# Patient Record
Sex: Male | Born: 1990 | Race: White | Hispanic: No | Marital: Married | Smoking: Former smoker
Health system: Southern US, Community
[De-identification: ages and names within clinical notes are randomized; demographics above are authoritative.]

## PROBLEM LIST (undated history)

## (undated) HISTORY — PX: TONSILLECTOMY: SUR1361

---

## 2015-09-08 ENCOUNTER — Encounter (HOSPITAL_BASED_OUTPATIENT_CLINIC_OR_DEPARTMENT_OTHER): Payer: Self-pay | Admitting: *Deleted

## 2015-09-08 ENCOUNTER — Emergency Department (HOSPITAL_BASED_OUTPATIENT_CLINIC_OR_DEPARTMENT_OTHER)
Admission: EM | Admit: 2015-09-08 | Discharge: 2015-09-08 | Disposition: A | Discharge status: Home / Self Care | Payer: Self-pay | Attending: Emergency Medicine | Admitting: Emergency Medicine

## 2015-09-08 ENCOUNTER — Emergency Department (HOSPITAL_BASED_OUTPATIENT_CLINIC_OR_DEPARTMENT_OTHER): Payer: Self-pay

## 2015-09-08 DIAGNOSIS — R229 Localized swelling, mass and lump, unspecified: Secondary | ICD-10-CM

## 2015-09-08 DIAGNOSIS — Z791 Long term (current) use of non-steroidal anti-inflammatories (NSAID): Secondary | ICD-10-CM | POA: Insufficient documentation

## 2015-09-08 DIAGNOSIS — Y998 Other external cause status: Secondary | ICD-10-CM | POA: Insufficient documentation

## 2015-09-08 DIAGNOSIS — W010XXA Fall on same level from slipping, tripping and stumbling without subsequent striking against object, initial encounter: Secondary | ICD-10-CM | POA: Insufficient documentation

## 2015-09-08 DIAGNOSIS — S6991XA Unspecified injury of right wrist, hand and finger(s), initial encounter: Secondary | ICD-10-CM | POA: Insufficient documentation

## 2015-09-08 DIAGNOSIS — Y9289 Other specified places as the place of occurrence of the external cause: Secondary | ICD-10-CM | POA: Insufficient documentation

## 2015-09-08 DIAGNOSIS — Y93K1 Activity, walking an animal: Secondary | ICD-10-CM | POA: Insufficient documentation

## 2015-09-08 NOTE — Discharge Instructions (Signed)
1. May take your Meloxicam for pain relief, or Ibuprofen 400-600 mg every 8 hours.  Do NOT combine Meloxicam and Ibuprofen.

## 2015-09-08 NOTE — ED Notes (Signed)
Ice pack provided for pt comfort.

## 2015-09-08 NOTE — ED Notes (Signed)
Pt slipped on a  wet porch and injured his right hand.

## 2015-09-08 NOTE — ED Notes (Signed)
MD at bedside. 

## 2015-09-08 NOTE — ED Notes (Signed)
Presents with rt hand injury, walking dog last PM, slipped on wet concrete floor,  Landed on rt hand, obvious swelling. Used ice for swelling.

## 2015-09-08 NOTE — ED Provider Notes (Signed)
CSN: [id number]     Arrival date & time 09/08/15  0846 History   First MD Initiated Contact with Patient 09/08/15 [phone]     Chief Complaint  Patient presents with  . Hand Pain     (Consider location/radiation/quality/duration/timing/severity/associated sxs/prior Treatment) HPI Matthew Woods is a 24 yo male with no significant PMH presenting with one day history of swollen right hand after sustaining mechanical fall.  Patient slipped while walking his dog, landing on the knuckles of a closed right fist.  Patient reports immediate swelling of the hand, worse last night, though improved this morning.  The pain is 0-1 at rest, but increases to 6/10 with movement.  He endorses pain over right 3, 4, and 5 MCPs, with radiation to the hypothenar eminence.  He reports full ROM, except that he cannot make a complete fist.  He denies pain to the wrist, elbow, shoulder, PIP, or DIP joints.   History reviewed. No pertinent past medical history. Past Surgical History  Procedure Laterality Date  . Tonsillectomy     No family history on file. Social History  Substance Use Topics  . Smoking status: Never Smoker   . Smokeless tobacco: None  . Alcohol Use: No    Review of Systems  Constitutional: Negative for fever and chills.  HENT: Negative for congestion.   Respiratory: Negative for cough and shortness of breath.   Cardiovascular: Negative for chest pain.  Gastrointestinal: Negative for abdominal pain.  Genitourinary: Negative for dysuria.  Musculoskeletal: Positive for joint swelling.  Skin: Negative for rash.      Allergies  Review of patient's allergies indicates no known allergies.  Home Medications   Prior to Admission medications   Medication Sig Start Date End Date Taking? Authorizing Provider  meloxicam (MOBIC) 15 MG tablet Take 15 mg by mouth daily.   Yes Historical Provider, MD   BP 138/88 mmHg  Pulse 66  Temp(Src) 98.6 F (37 C) (Oral)  Resp 16  Ht  (1.753 m)  Wt  257 lb (116.574 kg)  BMI 37.93 kg/m2  SpO2 97% Physical Exam  Constitutional: He appears well-developed and well-nourished. No distress.  HENT:  Head: Normocephalic and atraumatic.  Eyes: EOM are normal. No scleral icterus.  Cardiovascular: Normal rate, regular rhythm and normal heart sounds.   Pulmonary/Chest: Effort normal and breath sounds normal.  Musculoskeletal:  Swelling over right 3rd, 4th, and 5th MCPs.  TTP over dorsal right hand and hypothenar eminence.  ROM limited to 80% of full fist.  No pain or decreased ROM at wrist, elbow, or shoulder.  Radial pulse 2+ with collateral ulnar flow.   Skin: Skin is warm and dry. He is not diaphoretic.    ED Course  Procedures (including critical care time) Labs Review Labs Reviewed - No data to display  Imaging Review No results found. I have personally reviewed and evaluated these images and lab results as part of my medical decision-making.   EKG Interpretation None      MDM   Final diagnoses:  None   Hand contusion/soft tissue swelling: No fractures of hand or wrist.  Patient reports pain well controlled. He was advised to rest, ice, and elevate the limb. Also advised patient he may take his home Meloxicam or Ibuprofen 400-600 mg q8hours, but not to combine the two medications.  He expressed the desire to return to work tomorrow.   Jana Half, MD 09/08/15 3086  Nelva Nay, MD 09/08/15 1323

## 2016-06-01 ENCOUNTER — Encounter (HOSPITAL_BASED_OUTPATIENT_CLINIC_OR_DEPARTMENT_OTHER): Payer: Self-pay | Admitting: *Deleted

## 2016-06-01 ENCOUNTER — Emergency Department (HOSPITAL_BASED_OUTPATIENT_CLINIC_OR_DEPARTMENT_OTHER)
Admission: EM | Admit: 2016-06-01 | Discharge: 2016-06-01 | Disposition: A | Discharge status: Home / Self Care | Payer: No Typology Code available for payment source | Attending: Emergency Medicine | Admitting: Emergency Medicine

## 2016-06-01 DIAGNOSIS — K920 Hematemesis: Secondary | ICD-10-CM | POA: Diagnosis not present

## 2016-06-01 DIAGNOSIS — R7401 Elevation of levels of liver transaminase levels: Secondary | ICD-10-CM

## 2016-06-01 DIAGNOSIS — R740 Nonspecific elevation of levels of transaminase and lactic acid dehydrogenase [LDH]: Secondary | ICD-10-CM | POA: Diagnosis not present

## 2016-06-01 DIAGNOSIS — R112 Nausea with vomiting, unspecified: Secondary | ICD-10-CM | POA: Diagnosis present

## 2016-06-01 LAB — CBC WITH DIFFERENTIAL/PLATELET
BASOS PCT: 0 %
Basophils Absolute: 0 10*3/uL (ref 0.0–0.1)
EOS ABS: 0.1 10*3/uL (ref 0.0–0.7)
Eosinophils Relative: 1 %
HEMATOCRIT: 44.7 % (ref 39.0–52.0)
HEMOGLOBIN: 15.9 g/dL (ref 13.0–17.0)
LYMPHS ABS: 1.7 10*3/uL (ref 0.7–4.0)
Lymphocytes Relative: 18 %
MCH: 30.1 pg (ref 26.0–34.0)
MCHC: 35.6 g/dL (ref 30.0–36.0)
MCV: 84.7 fL (ref 78.0–100.0)
MONO ABS: 0.8 10*3/uL (ref 0.1–1.0)
MONOS PCT: 8 %
NEUTROS ABS: 7.4 10*3/uL (ref 1.7–7.7)
NEUTROS PCT: 73 %
PLATELETS: 224 10*3/uL (ref 150–400)
RBC: 5.28 MIL/uL (ref 4.22–5.81)
RDW: 12.1 % (ref 11.5–15.5)
WBC: 9.9 10*3/uL (ref 4.0–10.5)

## 2016-06-01 LAB — COMPREHENSIVE METABOLIC PANEL
ALBUMIN: 4.8 g/dL (ref 3.5–5.0)
ALK PHOS: 95 U/L (ref 38–126)
ALT: 114 U/L — ABNORMAL HIGH (ref 17–63)
ANION GAP: 7 (ref 5–15)
AST: 66 U/L — AB (ref 15–41)
BILIRUBIN TOTAL: 1.1 mg/dL (ref 0.3–1.2)
BUN: 15 mg/dL (ref 6–20)
CALCIUM: 9.6 mg/dL (ref 8.9–10.3)
CO2: 26 mmol/L (ref 22–32)
Chloride: 103 mmol/L (ref 101–111)
Creatinine, Ser: 1.19 mg/dL (ref 0.61–1.24)
GFR calc Af Amer: >60 mL/min (ref >60)
GFR calc non Af Amer: >60 mL/min (ref >60)
GLUCOSE: 93 mg/dL (ref 65–99)
Potassium: 3.9 mmol/L (ref 3.5–5.1)
SODIUM: 136 mmol/L (ref 135–145)
TOTAL PROTEIN: 8.2 g/dL — AB (ref 6.5–8.1)

## 2016-06-01 LAB — URINALYSIS, ROUTINE W REFLEX MICROSCOPIC
Bilirubin Urine: NEGATIVE
GLUCOSE, UA: NEGATIVE mg/dL
HGB URINE DIPSTICK: NEGATIVE
Ketones, ur: NEGATIVE mg/dL
Leukocytes, UA: NEGATIVE
Nitrite: NEGATIVE
PH: 5.5 (ref 5.0–8.0)
Protein, ur: 30 mg/dL — AB
SPECIFIC GRAVITY, URINE: 1.022 (ref 1.005–1.030)

## 2016-06-01 LAB — URINE MICROSCOPIC-ADD ON
RBC / HPF: NONE SEEN RBC/hpf (ref 0–5)
Squamous Epithelial / LPF: NONE SEEN
WBC, UA: NONE SEEN WBC/hpf (ref 0–5)

## 2016-06-01 LAB — LIPASE, BLOOD: Lipase: 23 U/L (ref 11–51)

## 2016-06-01 MED ORDER — OMEPRAZOLE 20 MG PO CPDR: 20.0000 mg | DELAYED_RELEASE_CAPSULE | Freq: Every day | ORAL | Status: DC

## 2016-06-01 MED ORDER — SODIUM CHLORIDE 0.9 % IV BOLUS (SEPSIS)
1000.0000 mL | Freq: Once | INTRAVENOUS | Status: AC
  Administered 2016-06-01: 1000 mL via INTRAVENOUS

## 2016-06-01 MED ORDER — ONDANSETRON HCL 4 MG PO TABS: 4.0000 mg | ORAL_TABLET | Freq: Four times a day (QID) | ORAL | Status: DC

## 2016-06-01 MED ORDER — FAMOTIDINE IN NACL 20-0.9 MG/50ML-% IV SOLN
20.0000 mg | Freq: Once | INTRAVENOUS | Status: AC
  Administered 2016-06-01: 20 mg via INTRAVENOUS
  Filled 2016-06-01: qty 50

## 2016-06-01 NOTE — Discharge Instructions (Signed)
Hematemesis Hematemesis is when you vomit blood. It is a sign of bleeding in the upper part of your digestive tract. This is also called your gastrointestinal (GI) tract. Your upper GI tract includes your mouth, throat, esophagus, stomach, and the first part of your small intestine (duodenum).  Hematemesis is usually caused by bleeding from your esophagus or stomach. You may suddenly vomit bright red blood. You might also vomit old blood. It may look like coffee grounds. You may also have other symptoms, such as:  Stomach pain.  Heartburn.  Black and tarry stool.  HOME CARE INSTRUCTIONS  Watch your hematemesis for any changes. The following actions may help to lessen any discomfort you are feeling:  Take medicines only as directed by your health care provider. Do not take aspirin, ibuprofen, or any other anti-inflammatory medicine without approval from your health care provider.  Rest as needed.  Drink small sips of clear liquids often, as long as you can keep them down. Try to drink enough fluids to keep your urine clear or pale yellow.  Do not drink alcohol.  Do not use any tobacco products, including cigarettes, chewing tobacco, or electronic cigarettes. If you need help quitting, ask your health care provider.  Keep all follow-up visits as directed by your health care provider. This is important. SEEK MEDICAL CARE IF:   The vomiting of blood worsens, or begins again after it has stopped.  You have persistent stomach pain.  You have nausea, indigestion, or heartburn.  You feel weak or dizzy. SEEK IMMEDIATE MEDICAL CARE IF:   You faint or feel extremely weak.  You have a rapid heartbeat.  You are urinating less than normal or not at all.  You have persistent vomiting.  You vomit large amounts of bloody or dark material.  You vomit bright red blood.  You pass large, dark, or bloody stools.  You have chest pain or trouble breathing.   This information is not  intended to replace advice given to you by your health care provider. Make sure you discuss any questions you have with your health care provider.   Document Released: 01/09/2005 Document Revised: 12/23/2014 Document Reviewed: 07/27/2014 Elsevier Interactive Patient Education 2016 [employer]  

## 2016-06-01 NOTE — ED Provider Notes (Signed)
CSN: [id number]     Arrival date & time 06/01/16  [id number] History   First MD Initiated Contact with Patient 06/01/16 [phone]     Chief Complaint  Patient presents with  . Emesis     (Consider location/radiation/quality/duration/timing/severity/associated sxs/prior Treatment) HPI Matthew Woods is a 25 y.o. male with PMH significant for HTN who presents with intermittent, unprovoked, episodes of N/V that have occurred approximately 4 times since Wednesday.  He reports he noticed "streaks of blood" he describes as dark-medium red yesterday and today.  These episodes of N/V last about 30 minutes and come on unprovoked.  He was having conversation this morning when it occurred.  Denies fever, chills, CP, SOB, dizziness, lightheadness, syncope, abdominal pain, current N/V, diarrhea, melena, hematochezia.  He does take occasional ibuprofen for knee pain, but denies daily use.  Denies EtOH use.  He does not smoke.  No modifying or aggravating factors.  History reviewed. No pertinent past medical history. Past Surgical History  Procedure Laterality Date  . Tonsillectomy     History reviewed. No pertinent family history. Social History  Substance Use Topics  . Smoking status: Never Smoker   . Smokeless tobacco: None  . Alcohol Use: No    Review of Systems All other systems negative unless otherwise stated in HPI    Allergies  Review of patient's allergies indicates no known allergies.  Home Medications   Prior to Admission medications   Not on File   BP 134/87 mmHg  Pulse 78  Temp(Src) 98.8 F (37.1 C) (Oral)  Resp 20  Ht 5\' 9"  (1.753 m)  Wt 112.038 kg  BMI 36.46 kg/m2  SpO2 98% Physical Exam  Constitutional: He is oriented to person, place, and time. He appears well-developed and well-nourished.  Non-toxic appearance. He does not have a sickly appearance. He does not appear ill.  HENT:  Head: Normocephalic and atraumatic.  Mouth/Throat: Oropharynx is clear and moist.  Eyes:  Conjunctivae are normal.  Neck: Normal range of motion. Neck supple.  Cardiovascular: Normal rate and regular rhythm.   Pulmonary/Chest: Effort normal and breath sounds normal. No accessory muscle usage or stridor. No respiratory distress. He has no wheezes. He has no rhonchi. He has no rales.  Abdominal: Soft. Bowel sounds are normal. He exhibits no distension. There is no tenderness. There is no rebound and no guarding.  Musculoskeletal: Normal range of motion.  Lymphadenopathy:    He has no cervical adenopathy.  Neurological: He is alert and oriented to person, place, and time.  Skin: Skin is warm and dry.  Psychiatric: He has a normal mood and affect. His behavior is normal.    ED Course  Procedures (including critical care time) Labs Review Labs Reviewed  COMPREHENSIVE METABOLIC PANEL - Abnormal; Notable for the following:    Total Protein 8.2 (*)    AST 66 (*)    ALT 114 (*)    All other components within normal limits  URINALYSIS, ROUTINE W REFLEX MICROSCOPIC (NOT AT [facility]) - Abnormal; Notable for the following:    APPearance CLOUDY (*)    Protein, ur 30 (*)    All other components within normal limits  URINE MICROSCOPIC-ADD ON - Abnormal; Notable for the following:    Bacteria, UA MANY (*)    All other components within normal limits  LIPASE, BLOOD  CBC WITH DIFFERENTIAL/PLATELET    Imaging Review No results found. I have personally reviewed and evaluated these images and lab results as part of my medical decision-making.  EKG Interpretation None      MDM   Final diagnoses:  Elevated transaminase level  Hematemesis with nausea   Patient presents with intermittent episodes of emesis with streaks of blood.  VSS, NAD.  On exam, patient appears well, non-toxic, or septic appearing.  Heart RRR, lungs CTAB, abdomen soft and benign.  DDx includes gastritis, mallory-weiss tear, PUD.  Patient is hemodynamically stable at this time.  Doubt esophageal rupture.  Will  obtain labs, give IVF, pepcid and reassess.    Patient has not had any vomiting here.  Labs remarkable for elevated transaminases, AST 66 ALT 114.  This appears to be consistent for his baseline, Care Everywhere shows last values 05/23/16 show AST 44 ALT 91.  Remaining labs unremarkable.  Hgb stable.    Discussed these findings with the patient and recommend GI follow up for possible further evaluation with EGD.  He had negative hepatitis panel performed and RUQ ultrasound 02/2015 with normal findings. Discharge home with Zofran and omeprazole. He does not take Tylenol, recommend avoiding Tylenol due to increased LFTs. Discussed return precautions.  Patient agrees and acknowledges the above plan for discharge.   Case has been discussed with Dr. Fredderick Phenix who agrees with the above plan for discharge.      Cheri Fowler, PA-C 06/01/16 1149  Rolan Bucco, MD 06/01/16 [phone]

## 2016-06-01 NOTE — ED Notes (Signed)
Pt reports emesis once last night, and once today. Denies any abd pain, or nausea at this time. Pt states "there was streaks of blood in it". Last bm yesterday, normal.

## 2017-05-13 ENCOUNTER — Encounter (HOSPITAL_BASED_OUTPATIENT_CLINIC_OR_DEPARTMENT_OTHER): Payer: Self-pay | Admitting: Emergency Medicine

## 2017-05-13 ENCOUNTER — Emergency Department (HOSPITAL_BASED_OUTPATIENT_CLINIC_OR_DEPARTMENT_OTHER)
Admission: EM | Admit: 2017-05-13 | Discharge: 2017-05-13 | Disposition: A | Discharge status: Home / Self Care | Payer: PRIVATE HEALTH INSURANCE | Attending: Emergency Medicine | Admitting: Emergency Medicine

## 2017-05-13 DIAGNOSIS — L551 Sunburn of second degree: Secondary | ICD-10-CM | POA: Diagnosis present

## 2017-05-13 DIAGNOSIS — Z23 Encounter for immunization: Secondary | ICD-10-CM | POA: Insufficient documentation

## 2017-05-13 MED ORDER — NAPROXEN 250 MG PO TABS
500.0000 mg | ORAL_TABLET | Freq: Once | ORAL | Status: AC
Start: 2017-05-13 — End: 2017-05-13
  Administered 2017-05-13: 500 mg via ORAL
  Filled 2017-05-13: qty 2

## 2017-05-13 MED ORDER — HYDROCODONE-ACETAMINOPHEN 5-325 MG PO TABS: 1.0000 | ORAL_TABLET | Freq: Four times a day (QID) | ORAL | 0 refills | Status: DC | PRN

## 2017-05-13 MED ORDER — HYDROCODONE-ACETAMINOPHEN 5-325 MG PO TABS
2.0000 | ORAL_TABLET | Freq: Once | ORAL | Status: AC
  Administered 2017-05-13: 2 via ORAL
  Filled 2017-05-13: qty 2

## 2017-05-13 MED ORDER — SILVER SULFADIAZINE 1 % EX CREA: TOPICAL_CREAM | Freq: Two times a day (BID) | CUTANEOUS | Status: DC

## 2017-05-13 MED ORDER — SILVER SULFADIAZINE 1 % EX CREA: TOPICAL_CREAM | CUTANEOUS | 1 refills | Status: DC

## 2017-05-13 MED ORDER — TETANUS-DIPHTH-ACELL PERTUSSIS 5-2.5-18.5 LF-MCG/0.5 IM SUSP
0.5000 mL | Freq: Once | INTRAMUSCULAR | Status: AC
  Administered 2017-05-13: 0.5 mL via INTRAMUSCULAR
  Filled 2017-05-13: qty 0.5

## 2017-05-13 NOTE — ED Triage Notes (Signed)
Pt with sunburn to upper arms and shoulders bilaterally. Pt has fluid filled blisters.

## 2017-05-13 NOTE — ED Provider Notes (Signed)
   [facility] Provider Note: Lowella DellJ. Lane Rosalene Wardrop, MD, FACEP  CSN: [id number] MRN: [id number] ARRIVAL: 05/13/17 at 0224 ROOM: [facility]01/[facility]01   CHIEF COMPLAINT  Sunburn   HISTORY OF PRESENT ILLNESS  Alric RanDavid Swantek is a 26 y.o. male who was out in the sun 3 days ago with his shoulders, neck and upper arms exposed. He subsequently developed sunburn which acutely worsened over the past 24 hours. He now has multiple blisters with weeping. There is significant associated pain which she rates as a 9 out of 10 at its worst. Pain is worse with movement or palpation. He has only taken Tylenol for the pain without significant relief.  [place] reveals the patient has received no opioid prescriptions in the past year.   History reviewed. No pertinent past medical history.  Past Surgical History:  Procedure Laterality Date  . TONSILLECTOMY      No family history on file.  Social History  Substance Use Topics  . Smoking status: Never Smoker  . Smokeless tobacco: Never Used  . Alcohol use No    Prior to Admission medications   Not on File    Allergies Rocephin [ceftriaxone]   REVIEW OF SYSTEMS  Negative except as noted here or in the History of Present Illness.   PHYSICAL EXAMINATION  Initial Vital Signs Blood pressure (!) 193/135, pulse 86, temperature 98.3 F (36.8 C), temperature source Oral, resp. rate 18, height 5\' 10"  (1.778 m), weight 113.4 kg (250 lb), SpO2 100 %.  Examination General: Well-developed, well-nourished male in no acute distress; appearance consistent with age of record HENT: normocephalic; atraumatic Eyes: Normal appearance Neck: supple Heart: regular rate and rhythm Lungs: clear to auscultation bilaterally Abdomen: soft; nondistended; nontender; bowel sounds present Extremities: No deformity; full range of motion; pulses normal Neurologic: Awake, alert and oriented; motor function  intact in all extremities and symmetric; no facial droop Skin: Warm and dry; first to second-degree sunburn of back of neck, shoulders and upper arms:    Psychiatric: Normal mood and affect   RESULTS  Summary of this visit's results, reviewed by myself:   EKG Interpretation  Date/Time:    Ventricular Rate:    PR Interval:    QRS Duration:   QT Interval:    QTC Calculation:   R Axis:     Text Interpretation:        Laboratory Studies: No results found for this or any previous visit (from the past 24 hour(s)). Imaging Studies: No results found.  ED COURSE  Nursing notes and initial vitals signs, including pulse oximetry, reviewed.  Vitals:   05/13/17 0229  BP: (!) 193/135  Pulse: 86  Resp: 18  Temp: 98.3 F (36.8 C)  TempSrc: Oral  SpO2: 100%  Weight: 113.4 kg (250 lb)  Height: 5\' 10"  (1.778 m)    PROCEDURES    ED DIAGNOSES     ICD-9-CM ICD-10-CM   1. Sunburn of second degree 692.76 L55.1        Marlos Carmen, Jonny RuizJohn, MD 05/13/17 [phone]

## 2017-09-05 ENCOUNTER — Emergency Department (HOSPITAL_BASED_OUTPATIENT_CLINIC_OR_DEPARTMENT_OTHER)
Admission: EM | Admit: 2017-09-05 | Discharge: 2017-09-05 | Disposition: A | Discharge status: Home / Self Care | Payer: PRIVATE HEALTH INSURANCE | Attending: Emergency Medicine | Admitting: Emergency Medicine

## 2017-09-05 ENCOUNTER — Encounter (HOSPITAL_BASED_OUTPATIENT_CLINIC_OR_DEPARTMENT_OTHER): Payer: Self-pay | Admitting: Emergency Medicine

## 2017-09-05 DIAGNOSIS — R197 Diarrhea, unspecified: Secondary | ICD-10-CM | POA: Insufficient documentation

## 2017-09-05 DIAGNOSIS — R112 Nausea with vomiting, unspecified: Secondary | ICD-10-CM | POA: Diagnosis not present

## 2017-09-05 LAB — CBC WITH DIFFERENTIAL/PLATELET
Basophils Absolute: 0 10*3/uL (ref 0.0–0.1)
Basophils Relative: 1 %
EOS ABS: 0.1 10*3/uL (ref 0.0–0.7)
EOS PCT: 1 %
HCT: 41.1 % (ref 39.0–52.0)
Hemoglobin: 14.3 g/dL (ref 13.0–17.0)
LYMPHS PCT: 22 %
Lymphs Abs: 1.8 10*3/uL (ref 0.7–4.0)
MCH: 30.6 pg (ref 26.0–34.0)
MCHC: 34.8 g/dL (ref 30.0–36.0)
MCV: 87.8 fL (ref 78.0–100.0)
MONO ABS: 0.9 10*3/uL (ref 0.1–1.0)
Monocytes Relative: 11 %
Neutro Abs: 5.2 10*3/uL (ref 1.7–7.7)
Neutrophils Relative %: 65 %
PLATELETS: 222 10*3/uL (ref 150–400)
RBC: 4.68 MIL/uL (ref 4.22–5.81)
RDW: 12.9 % (ref 11.5–15.5)
WBC: 7.9 10*3/uL (ref 4.0–10.5)

## 2017-09-05 LAB — URINALYSIS, ROUTINE W REFLEX MICROSCOPIC
BILIRUBIN URINE: NEGATIVE
GLUCOSE, UA: NEGATIVE mg/dL
HGB URINE DIPSTICK: NEGATIVE
Ketones, ur: NEGATIVE mg/dL
Leukocytes, UA: NEGATIVE
Nitrite: NEGATIVE
PROTEIN: NEGATIVE mg/dL
SPECIFIC GRAVITY, URINE: 1.02 (ref 1.005–1.030)
pH: 6 (ref 5.0–8.0)

## 2017-09-05 LAB — COMPREHENSIVE METABOLIC PANEL
ALBUMIN: 4.4 g/dL (ref 3.5–5.0)
ALT: 84 U/L — AB (ref 17–63)
AST: 54 U/L — AB (ref 15–41)
Alkaline Phosphatase: 89 U/L (ref 38–126)
Anion gap: 6 (ref 5–15)
BILIRUBIN TOTAL: 1.4 mg/dL — AB (ref 0.3–1.2)
BUN: 21 mg/dL — AB (ref 6–20)
CO2: 25 mmol/L (ref 22–32)
CREATININE: 1.26 mg/dL — AB (ref 0.61–1.24)
Calcium: 9.2 mg/dL (ref 8.9–10.3)
Chloride: 104 mmol/L (ref 101–111)
GFR calc Af Amer: >60 mL/min (ref >60)
GLUCOSE: 112 mg/dL — AB (ref 65–99)
Potassium: 3.4 mmol/L — ABNORMAL LOW (ref 3.5–5.1)
Sodium: 135 mmol/L (ref 135–145)
TOTAL PROTEIN: 7.9 g/dL (ref 6.5–8.1)

## 2017-09-05 LAB — LIPASE, BLOOD: Lipase: 27 U/L (ref 11–51)

## 2017-09-05 MED ORDER — ONDANSETRON HCL 4 MG/2ML IJ SOLN
4.0000 mg | Freq: Once | INTRAMUSCULAR | Status: AC | PRN
  Administered 2017-09-05: 4 mg via INTRAVENOUS

## 2017-09-05 MED ORDER — SODIUM CHLORIDE 0.9 % IV BOLUS (SEPSIS)
1000.0000 mL | Freq: Once | INTRAVENOUS | Status: AC
  Administered 2017-09-05: 1000 mL via INTRAVENOUS

## 2017-09-05 MED ORDER — ONDANSETRON HCL 4 MG/2ML IJ SOLN
INTRAMUSCULAR | Status: AC
  Administered 2017-09-05: 4 mg via INTRAVENOUS
  Filled 2017-09-05: qty 2

## 2017-09-05 MED ORDER — ONDANSETRON 4 MG PO TBDP: 4.0000 mg | ORAL_TABLET | Freq: Three times a day (TID) | ORAL | 0 refills | Status: DC | PRN

## 2017-09-05 MED FILL — ONDANSETRON ODT 4 MG TABLET: 4 | 3 days supply | Qty: 8 | Fill #0

## 2017-09-05 NOTE — Discharge Instructions (Signed)
Follow-up with gastroenterology for your elevated liver enzymes.

## 2017-09-05 NOTE — ED Notes (Signed)
Pt able to tolerate sips of water with no n/v.   

## 2017-09-05 NOTE — ED Provider Notes (Signed)
[facility]   CSN: [id number] Arrival date & time: 09/05/17  4098     History   Chief Complaint Chief Complaint  Patient presents with  . Emesis    HPI Matthew Woods is a 26 y.o. male.  The history is provided by the patient.  Patient presents with 3 day history of nausea vomiting and around a two-day history of diarrhea. No sick contacts. Has had episodes of the same in the past that come and go. States last night he was only able to eat one plate of dinner would normally he will eat 2 or 3. No fevers. She's been throwing up some incontinence with occasional small amount of blood. No blood in the diarrhea. No real abdominal pain. Does not drink alcohol or take Tylenol. Has had mildly elevated liver enzymes on 2 previous ER visits. States he's followed up with his primary care doctor is been found. He has had negative hepatitis panel and reassuring hepatic ultrasound.  History reviewed. No pertinent past medical history.  There are no active problems to display for this patient.   Past Surgical History:  Procedure Laterality Date  . TONSILLECTOMY         Home Medications    Prior to Admission medications   Medication Sig Start Date End Date Taking? Authorizing Provider  ondansetron (ZOFRAN-ODT) 4 MG disintegrating tablet Take 1 tablet (4 mg total) by mouth every 8 (eight) hours as needed for nausea or vomiting. 09/05/17   Benjiman Core, MD    Family History No family history on file.  Social History Social History  Substance Use Topics  . Smoking status: Never Smoker  . Smokeless tobacco: Never Used  . Alcohol use No     Allergies   Rocephin [ceftriaxone]   Review of Systems Review of Systems  Constitutional: Positive for appetite change. Negative for chills, fever and unexpected weight change.  HENT: Negative for congestion and sore throat.   Eyes: Negative for photophobia.  Respiratory: Negative for shortness of breath.     Cardiovascular: Negative for chest pain.  Gastrointestinal: Positive for diarrhea, nausea and vomiting. Negative for abdominal pain and blood in stool.  Endocrine: Negative for polyuria.  Genitourinary: Negative for dysuria.  Musculoskeletal: Negative for back pain.  Neurological: Negative for seizures.  Hematological: Negative for adenopathy.  Psychiatric/Behavioral: Negative for confusion.     Physical Exam Updated Vital Signs BP 115/71   Pulse 73   Temp 98.2 F (36.8 C) (Oral)   Resp 17   Ht  (1.753 m)   Wt 112 kg (247 lb)   SpO2 97%   BMI 36.48 kg/m   Physical Exam  Constitutional: He appears well-developed.  HENT:  Head: Normocephalic.  Eyes: Pupils are equal, round, and reactive to light. No scleral icterus.  Neck: Neck supple.  Cardiovascular: Normal rate.   Pulmonary/Chest: Effort normal. He has no wheezes. He has no rales.  Abdominal: Soft. He exhibits no mass.  Musculoskeletal: He exhibits no edema.  Neurological: He is alert.  Skin: Skin is warm. Capillary refill takes less than 2 seconds.     ED Treatments / Results  Labs (all labs ordered are listed, but only abnormal results are displayed) Labs Reviewed  COMPREHENSIVE METABOLIC PANEL - Abnormal; Notable for the following:       Result Value   Potassium 3.4 (*)    Glucose, Bld 112 (*)    BUN 21 (*)    Creatinine, Ser 1.26 (*)  AST 54 (*)    ALT 84 (*)    Total Bilirubin 1.4 (*)    All other components within normal limits  LIPASE, BLOOD  URINALYSIS, ROUTINE W REFLEX MICROSCOPIC  CBC WITH DIFFERENTIAL/PLATELET    EKG  EKG Interpretation None       Radiology No results found.  Procedures Procedures (including critical care time)  Medications Ordered in ED Medications  ondansetron (ZOFRAN) injection 4 mg (4 mg Intravenous Given 09/05/17 0714)  sodium chloride 0.9 % bolus 1,000 mL (0 mLs Intravenous Stopped 09/05/17 0754)     Initial Impression / Assessment and Plan / ED  Course  I have reviewed the triage vital signs and the nursing notes.  Pertinent labs & imaging results that were available during my care of the patient were reviewed by me and considered in my medical decision making (see chart for details).      Patient with nausea vomiting with diarrhea.. Benign exam. LFTs mildly elevated and have been in the past. Bilirubin also now mildly elevated at 1.4. Has negative hepatitis panel and reportedly negative ultrasound.- Feels better after treatment. Will discharge home with Zofran. Follow up with GI her PCP.  Final Clinical Impressions(s) / ED Diagnoses   Final diagnoses:  Nausea vomiting and diarrhea    New Prescriptions New Prescriptions   ONDANSETRON (ZOFRAN-ODT) 4 MG DISINTEGRATING TABLET    Take 1 tablet (4 mg total) by mouth every 8 (eight) hours as needed for nausea or vomiting.     Benjiman Core, MD 09/05/17 1007

## 2017-09-05 NOTE — ED Triage Notes (Signed)
Pt reports vomiting x 3 days with blood in emesis this am. Pt reports diarrhea started this morning.

## 2018-03-04 ENCOUNTER — Encounter (HOSPITAL_BASED_OUTPATIENT_CLINIC_OR_DEPARTMENT_OTHER): Payer: Self-pay

## 2018-03-04 ENCOUNTER — Emergency Department (HOSPITAL_BASED_OUTPATIENT_CLINIC_OR_DEPARTMENT_OTHER)
Admission: EM | Admit: 2018-03-04 | Discharge: 2018-03-04 | Disposition: A | Discharge status: Home / Self Care | Payer: PRIVATE HEALTH INSURANCE | Attending: Emergency Medicine | Admitting: Emergency Medicine

## 2018-03-04 ENCOUNTER — Other Ambulatory Visit: Payer: Self-pay

## 2018-03-04 ENCOUNTER — Emergency Department (HOSPITAL_BASED_OUTPATIENT_CLINIC_OR_DEPARTMENT_OTHER): Payer: PRIVATE HEALTH INSURANCE

## 2018-03-04 DIAGNOSIS — R197 Diarrhea, unspecified: Secondary | ICD-10-CM | POA: Diagnosis not present

## 2018-03-04 DIAGNOSIS — R112 Nausea with vomiting, unspecified: Secondary | ICD-10-CM | POA: Insufficient documentation

## 2018-03-04 DIAGNOSIS — R1084 Generalized abdominal pain: Secondary | ICD-10-CM | POA: Diagnosis present

## 2018-03-04 LAB — URINALYSIS, ROUTINE W REFLEX MICROSCOPIC
GLUCOSE, UA: NEGATIVE mg/dL
Hgb urine dipstick: NEGATIVE
Ketones, ur: 15 mg/dL — AB
Leukocytes, UA: NEGATIVE
Nitrite: NEGATIVE
PH: 5.5 (ref 5.0–8.0)
Protein, ur: 30 mg/dL — AB

## 2018-03-04 LAB — CBC WITH DIFFERENTIAL/PLATELET
Basophils Absolute: 0 10*3/uL (ref 0.0–0.1)
Basophils Relative: 0 %
Eosinophils Absolute: 0 10*3/uL (ref 0.0–0.7)
Eosinophils Relative: 0 %
HCT: 50.5 % (ref 39.0–52.0)
Hemoglobin: 17.6 g/dL — ABNORMAL HIGH (ref 13.0–17.0)
LYMPHS ABS: 0.3 10*3/uL — AB (ref 0.7–4.0)
Lymphocytes Relative: 1 %
MCH: 29.4 pg (ref 26.0–34.0)
MCHC: 34.9 g/dL (ref 30.0–36.0)
MCV: 84.3 fL (ref 78.0–100.0)
MONO ABS: 2.1 10*3/uL — AB (ref 0.1–1.0)
Monocytes Relative: 8 %
Neutro Abs: 24.1 10*3/uL — ABNORMAL HIGH (ref 1.7–7.7)
Neutrophils Relative %: 91 %
PLATELETS: 236 10*3/uL (ref 150–400)
RBC: 5.99 MIL/uL — AB (ref 4.22–5.81)
RDW: 12.6 % (ref 11.5–15.5)
WBC: 26.5 10*3/uL — ABNORMAL HIGH (ref 4.0–10.5)

## 2018-03-04 LAB — COMPREHENSIVE METABOLIC PANEL
ALK PHOS: 115 U/L (ref 38–126)
ALT: 76 U/L — AB (ref 17–63)
AST: 53 U/L — ABNORMAL HIGH (ref 15–41)
Albumin: 5.1 g/dL — ABNORMAL HIGH (ref 3.5–5.0)
Anion gap: 12 (ref 5–15)
BILIRUBIN TOTAL: 1.4 mg/dL — AB (ref 0.3–1.2)
BUN: 19 mg/dL (ref 6–20)
CALCIUM: 9.8 mg/dL (ref 8.9–10.3)
CO2: 25 mmol/L (ref 22–32)
CREATININE: 1.38 mg/dL — AB (ref 0.61–1.24)
Chloride: 101 mmol/L (ref 101–111)
Glucose, Bld: 145 mg/dL — ABNORMAL HIGH (ref 65–99)
Potassium: 4.6 mmol/L (ref 3.5–5.1)
SODIUM: 138 mmol/L (ref 135–145)
Total Protein: 9 g/dL — ABNORMAL HIGH (ref 6.5–8.1)

## 2018-03-04 LAB — LIPASE, BLOOD: Lipase: 25 U/L (ref 11–51)

## 2018-03-04 LAB — URINALYSIS, MICROSCOPIC (REFLEX)

## 2018-03-04 MED ORDER — OSELTAMIVIR PHOSPHATE 75 MG PO CAPS: 75.0000 mg | ORAL_CAPSULE | Freq: Two times a day (BID) | ORAL | 0 refills | Status: DC

## 2018-03-04 MED ORDER — ONDANSETRON HCL 4 MG PO TABS: 4.0000 mg | ORAL_TABLET | Freq: Four times a day (QID) | ORAL | 0 refills | Status: AC

## 2018-03-04 MED ORDER — DICYCLOMINE HCL 20 MG PO TABS: 20.0000 mg | ORAL_TABLET | Freq: Two times a day (BID) | ORAL | 0 refills | Status: AC

## 2018-03-04 MED ORDER — SODIUM CHLORIDE 0.9 % IV BOLUS (SEPSIS)
1000.0000 mL | Freq: Once | INTRAVENOUS | Status: AC
  Administered 2018-03-04: 1000 mL via INTRAVENOUS

## 2018-03-04 MED ORDER — IOPAMIDOL (ISOVUE-300) INJECTION 61%
100.0000 mL | Freq: Once | INTRAVENOUS | Status: AC | PRN
  Administered 2018-03-04: 100 mL via INTRAVENOUS

## 2018-03-04 MED ORDER — ONDANSETRON HCL 4 MG/2ML IJ SOLN
4.0000 mg | Freq: Once | INTRAMUSCULAR | Status: AC
  Administered 2018-03-04: 4 mg via INTRAVENOUS
  Filled 2018-03-04: qty 2

## 2018-03-04 MED ORDER — KETOROLAC TROMETHAMINE 30 MG/ML IJ SOLN
30.0000 mg | Freq: Once | INTRAMUSCULAR | Status: AC
Start: 2018-03-04 — End: 2018-03-04
  Administered 2018-03-04: 30 mg via INTRAVENOUS
  Filled 2018-03-04: qty 1

## 2018-03-04 NOTE — Discharge Instructions (Signed)
As we discussed, take the Zofran for nausea.  Your flu will be coming back in the next 24 hours.  He can call the emergency department to see the results.  If your results are positive, you can take the Tamiflu as directed.  If negative, disregard the medication.  Take Bentyl as needed for abdominal pain.  Make sure you are staying hydrated and getting plenty of fluids.  As we discussed, closely monitor your symptoms and return to the emergency department for any worsening or concerning symptoms.  Return to the emergency department for any fever, worsening abdominal pain, persistent vomiting, unable to keep any food down, chest pain, difficulty breathing or any other worsening or concerning symptoms.

## 2018-03-04 NOTE — ED Triage Notes (Signed)
C/o abd pain, n/v/d x today-NAD-presents to triage in w/c

## 2018-03-04 NOTE — ED Notes (Signed)
Patient transported to CT 

## 2018-03-04 NOTE — ED Notes (Signed)
Assumed care of patient from Hick[place]ophie, [place]. Pt resting quietly. No distress. No complaints. States he was here for abdominal pain that has since improved.

## 2018-03-04 NOTE — ED Notes (Signed)
ED Provider at bedside. 

## 2018-03-04 NOTE — ED Provider Notes (Signed)
[place] EMERGENCY DEPARTMENT Provider Note   CSN: [id number] Arrival date & time: 03/04/18  1432     History   Chief Complaint Chief Complaint  Patient presents with  . Abdominal Pain    HPI Matthew Woods is a 27 y.o. male with no significant past medical history presents for evaluation of generalized abdominal pain, nausea, vomiting, diarrhea that began this morning.  Patient reports that he woke up this morning and was nauseous.  He states that then he started having several episodes of vomiting.  He states he started having diffuse generalized abdominal pain that began after the vomiting.  He reports multiple episodes of nonbloody, nonbilious emesis.  Here also reports multiple episodes of diarrhea.  No blood noted in the stool.  He states he did not eat anything abnormal yesterday.  He states that there is been some coworkers who have been sick with similar symptoms.  Patient reports that he has not taken anything for the pain.  Patient denies any fever, chest pain, dysuria, hematuria, difficulty breathing, nasal congestion, rhinorrhea, cough.  Patient is unsure if he got a flu shot this year.  The history is provided by the patient.    History reviewed. No pertinent past medical history.  There are no active problems to display for this patient.   Past Surgical History:  Procedure Laterality Date  . TONSILLECTOMY         Home Medications    Prior to Admission medications   Medication Sig Start Date End Date Taking? Authorizing Provider  dicyclomine (BENTYL) 20 MG tablet Take 1 tablet (20 mg total) by mouth 2 (two) times daily. 03/04/18   Volanda Napoleon, PA-C  ondansetron (ZOFRAN) 4 MG tablet Take 1 tablet (4 mg total) by mouth every 6 (six) hours. 03/04/18   Volanda Napoleon, PA-C  oseltamivir (TAMIFLU) 75 MG capsule Take 1 capsule (75 mg total) by mouth every 12 (twelve) hours. 03/04/18   Volanda Napoleon, PA-C    Family History No family history on  file.  Social History Social History   Tobacco Use  . Smoking status: Never Smoker  . Smokeless tobacco: Never Used  Substance Use Topics  . Alcohol use: No  . Drug use: No     Allergies   Rocephin [ceftriaxone]   Review of Systems Review of Systems  Constitutional: Negative for chills and fever.  HENT: Negative for congestion and rhinorrhea.   Eyes: Negative for visual disturbance.  Respiratory: Negative for cough and shortness of breath.   Cardiovascular: Negative for chest pain.  Gastrointestinal: Positive for abdominal pain, diarrhea, nausea and vomiting.  Genitourinary: Negative for dysuria and hematuria.  Musculoskeletal: Negative for back pain and neck pain.  Skin: Negative for rash.  Neurological: Negative for dizziness, weakness, numbness and headaches.     Physical Exam Updated Vital Signs BP 117/73 (BP Location: Left Arm)   Pulse (!) 112   Temp 98.2 F (36.8 C) (Oral)   Resp 18   Ht 5' 9"  (1.753 m)   Wt 112 kg (247 lb)   SpO2 95%   BMI 36.48 kg/m   Physical Exam  Constitutional: He is oriented to person, place, and time. He appears well-developed and well-nourished.  Appears uncomfortable but no acute distress   HENT:  Head: Normocephalic and atraumatic.  Mouth/Throat: Oropharynx is clear and moist and mucous membranes are normal.  Eyes: Conjunctivae, EOM and lids are normal. Pupils are equal, round, and reactive to light.  Neck: Full  passive range of motion without pain.  Cardiovascular: Normal rate, regular rhythm, normal heart sounds and normal pulses. Exam reveals no gallop and no friction rub.  No murmur heard. Pulmonary/Chest: Effort normal and breath sounds normal.  No evidence of respiratory distress. Able to speak in full sentences without difficulty.  Abdominal: Soft. Normal appearance. There is generalized tenderness. There is no rigidity, no guarding, no CVA tenderness and no tenderness at McBurney's point.  Is soft, nondistended.   Patient with diffuse tenderness with no focal point.  No tenderness at McBurney's point.  No CVA tenderness bilaterally.  Musculoskeletal: Normal range of motion.  Neurological: He is alert and oriented to person, place, and time.  Skin: Skin is warm and dry. Capillary refill takes less than 2 seconds.  Psychiatric: He has a normal mood and affect. His speech is normal.  Nursing note and vitals reviewed.    ED Treatments / Results  Labs (all labs ordered are listed, but only abnormal results are displayed) Labs Reviewed  URINALYSIS, ROUTINE W REFLEX MICROSCOPIC - Abnormal; Notable for the following components:      Result Value   Color, Urine AMBER (*)    APPearance HAZY (*)    Specific Gravity, Urine >1.030 (*)    Bilirubin Urine SMALL (*)    Ketones, ur 15 (*)    Protein, ur 30 (*)    All other components within normal limits  COMPREHENSIVE METABOLIC PANEL - Abnormal; Notable for the following components:   Glucose, Bld 145 (*)    Creatinine, Ser 1.38 (*)    Total Protein 9.0 (*)    Albumin 5.1 (*)    AST 53 (*)    ALT 76 (*)    Total Bilirubin 1.4 (*)    All other components within normal limits  CBC WITH DIFFERENTIAL/PLATELET - Abnormal; Notable for the following components:   WBC 26.5 (*)    RBC 5.99 (*)    Hemoglobin 17.6 (*)    Neutro Abs 24.1 (*)    Lymphs Abs 0.3 (*)    Monocytes Absolute 2.1 (*)    All other components within normal limits  URINALYSIS, MICROSCOPIC (REFLEX) - Abnormal; Notable for the following components:   Bacteria, UA MANY (*)    Squamous Epithelial / LPF 0-5 (*)    All other components within normal limits  LIPASE, BLOOD  INFLUENZA PANEL BY PCR (TYPE A & B)    EKG  EKG Interpretation None       Radiology Ct Abdomen Pelvis W Contrast  Result Date: 03/04/2018 CLINICAL DATA:  Generalized abdominal pain with nausea vomiting and diarrhea EXAM: CT ABDOMEN AND PELVIS WITH CONTRAST TECHNIQUE: Multidetector CT imaging of the abdomen and  pelvis was performed using the standard protocol following bolus administration of intravenous contrast. CONTRAST:  143m ISOVUE-300 IOPAMIDOL (ISOVUE-300) INJECTION 61% COMPARISON:  Report 06/08/2014 FINDINGS: Lower chest: No acute consolidation or pleural effusion. Multiple tiny pulmonary nodules in the left lower lobe possibly post infectious or inflammatory. Normal heart size. Hepatobiliary: Hepatic steatosis. No calcified gallstones or biliary dilatation Pancreas: Unremarkable. No pancreatic ductal dilatation or surrounding inflammatory changes. Spleen: Normal in size without focal abnormality. Adrenals/Urinary Tract: Adrenal glands are unremarkable. Kidneys are normal, without renal calculi, focal lesion, or hydronephrosis. Bladder is unremarkable. Stomach/Bowel: Stomach is nonenlarged. No dilated small bowel. No colon wall thickening. Appendix not well seen but no right lower quadrant inflammatory process. Vascular/Lymphatic: No significant vascular findings are present. No enlarged abdominal or pelvic lymph nodes. Reproductive: Prostate is  unremarkable. Other: Negative for free air or free fluid. Small fat in the left inguinal canal. Musculoskeletal: Disc space narrowing at L5-S1 with large posterior osteophyte and marked left greater than right foraminal stenosis. Narrowing of the canal at this level. IMPRESSION: 1. No CT evidence for acute intra-abdominal or pelvic abnormality. 2. Hepatic steatosis 3. 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 Electronically Signed   By: Donavan Foil M.D.   On: 03/04/2018 18:13    Procedures Procedures (including critical care time)  Medications Ordered in ED Medications  sodium chloride 0.9 % bolus 1,000 mL (0 mLs Intravenous Stopped 03/04/18 1613)  ketorolac (TORADOL) 30 MG/ML injection 30 mg (30 mg Intravenous Given 03/04/18 1518)  ondansetron (ZOFRAN) injection  4 mg (4 mg Intravenous Given 03/04/18 1518)  iopamidol (ISOVUE-300) 61 % injection 100 mL (100 mLs Intravenous Contrast Given 03/04/18 1756)     Initial Impression / Assessment and Plan / ED Course  I have reviewed the triage vital signs and the nursing notes.  Pertinent labs & imaging results that were available during my care of the patient were reviewed by me and considered in my medical decision making (see chart for details).     27 year old male who presents for evaluation of abdominal pain, nausea, vomiting, diarrhea that is been ongoing since this morning.  No fevers, chest pain, difficulty breathing, dysuria, hematuria.  Has not been able to tolerate much p.o. since onset of symptoms.  Reports abdominal pain started after vomiting. Patient is afebrile, non-toxic appearing, sitting comfortably on examination table.  Vital signs reviewed.  Patient is slightly tachycardic.  On exam, patient has diffuse abdominal tenderness with no focal point.  Consider viral GI process versus influenza.  History/physical exam is not concerning for kidney stone, appendicitis, diverticulitis.  Plan to check basic labs. IVF given for fluid resuscitation. Analgesics provided in the department.  Reevaluation.  Patient reports improvement in pain and nausea after analgesics.  Labs reviewed.  Lipase unremarkable.  CBC shows leukocytosis of 26.5.  Otherwise unremarkable.  CMP shows elevated creatinine at 1.38.  AST and ALT are also slightly bumped.  Normal alk phos. slight elevation of bilirubin.  Records reviewed show that patient always has slight elevation in AST and ALT and bilirubin.  This is not new. Patient with no complaints of fever, chest pain, coughing, dysuria, hematuria.   CT abdomen pelvis shows no acute intra-abdominal abnormality.  They do note that the appendix cannot be fully visualized but they do not see any inflammatory changes around the right lower quadrant.  I discussed results with  reevaluation shows improvement in abdominal exam. Mild generalized tenderness with no focal point.  No tenderness at McBurney's point tenderness.  Overall, abdominal exam is improved since initial ED evaluation.  He reports feeling much better.  He reports that he has not had any vomiting since being here in the ED.  I discussed results with patient and findings.  Instructed patient to closely monitor the symptoms and return for any worsening or concerning symptoms.  Though Appendicitis cannot definitively cannot be ruled out, patient's presentation does not the typical course of appendicitis.  Additionally, patient would like to be tested for the flu.  He states he lives with his elderly grandmother and he does not want to pass it onto her if he is positive.  I feel that this is reasonable.  We will plan to send home with Tamiflu since  his symptoms started within 24 hours.  Encouraged him not to take it until he follows up with the flu results.   Patient tolerating p.o. in the department.  Repeat exam is improved significantly.  Patient stable for discharge at this time.  Encourage at home supportive therapies.  Instructed patient to follow-up with his primary care doctor the next 24-48 hours for further evaluation. Patient had ample opportunity for questions and discussion. All patient's questions were answered with full understanding. Strict return precautions discussed. Patient expresses understanding and agreement to plan.   Final Clinical Impressions(s) / ED Diagnoses   Final diagnoses:  Nausea vomiting and diarrhea  Generalized abdominal pain    ED Discharge Orders        Ordered    oseltamivir (TAMIFLU) 75 MG capsule  Every 12 hours     03/04/18 1915    ondansetron (ZOFRAN) 4 MG tablet  Every 6 hours     03/04/18 1916    dicyclomine (BENTYL) 20 MG tablet  2 times daily     03/04/18 1916       Desma Mcgregor 03/05/18 0016    Fredia Sorrow, MD 03/06/18 1524

## 2018-03-04 NOTE — ED Notes (Signed)
Pt given ginger ale.

## 2018-03-05 LAB — INFLUENZA PANEL BY PCR (TYPE A & B)
INFLAPCR: NEGATIVE
INFLBPCR: NEGATIVE

## 2018-03-05 MED FILL — DICYCLOMINE 20 MG TABLET: 20 | 10 days supply | Qty: 20 | Fill #0

## 2018-03-05 MED FILL — ONDANSETRON HCL 4 MG TABLET: 4 | 2 days supply | Qty: 10 | Fill #0

## 2018-03-06 NOTE — ED Notes (Signed)
Called pt.back after pt. Left message on this RN's voicemail for Influenza results.  No answer, voicemail is not available.  3/22/219

## 2018-07-19 ENCOUNTER — Other Ambulatory Visit: Payer: Self-pay

## 2018-07-19 ENCOUNTER — Emergency Department (HOSPITAL_BASED_OUTPATIENT_CLINIC_OR_DEPARTMENT_OTHER)
Admission: EM | Admit: 2018-07-19 | Discharge: 2018-07-19 | Disposition: A | Discharge status: Home / Self Care | Payer: BLUE CROSS/BLUE SHIELD | Attending: Emergency Medicine | Admitting: Emergency Medicine

## 2018-07-19 ENCOUNTER — Encounter (HOSPITAL_BASED_OUTPATIENT_CLINIC_OR_DEPARTMENT_OTHER): Payer: Self-pay | Admitting: Emergency Medicine

## 2018-07-19 DIAGNOSIS — Z79899 Other long term (current) drug therapy: Secondary | ICD-10-CM | POA: Insufficient documentation

## 2018-07-19 DIAGNOSIS — M5442 Lumbago with sciatica, left side: Secondary | ICD-10-CM | POA: Diagnosis not present

## 2018-07-19 DIAGNOSIS — M545 Low back pain: Secondary | ICD-10-CM | POA: Diagnosis present

## 2018-07-19 MED ORDER — KETOROLAC TROMETHAMINE 30 MG/ML IJ SOLN
30.0000 mg | Freq: Once | INTRAMUSCULAR | Status: AC
  Administered 2018-07-19: 30 mg via INTRAMUSCULAR
  Filled 2018-07-19: qty 1

## 2018-07-19 MED ORDER — DEXAMETHASONE SODIUM PHOSPHATE 10 MG/ML IJ SOLN
10.0000 mg | Freq: Once | INTRAMUSCULAR | Status: AC
  Administered 2018-07-19: 10 mg via INTRAMUSCULAR
  Filled 2018-07-19: qty 1

## 2018-07-19 MED ORDER — CYCLOBENZAPRINE HCL 10 MG PO TABS: 10.0000 mg | ORAL_TABLET | Freq: Two times a day (BID) | ORAL | 0 refills | Status: AC | PRN

## 2018-07-19 MED ORDER — NAPROXEN 500 MG PO TABS: 500.0000 mg | ORAL_TABLET | Freq: Two times a day (BID) | ORAL | 0 refills | Status: AC

## 2018-07-19 MED ORDER — MORPHINE SULFATE (PF) 4 MG/ML IV SOLN
8.0000 mg | Freq: Once | INTRAVENOUS | Status: AC
  Administered 2018-07-19: 8 mg via INTRAMUSCULAR
  Filled 2018-07-19: qty 2

## 2018-07-19 NOTE — ED Provider Notes (Signed)
[facility] EMERGENCY DEPARTMENT Provider Note   CSN: [id number] Arrival date & time: 07/19/18  1014     History   Chief Complaint Chief Complaint  Patient presents with  . Back Pain    HPI Matthew Woods is a 27 y.o. male.  Patient is a 27 year old male with no significant past medical history who presents with back pain.  He states he does a lot of heavy lifting at work.  He said yesterday he felt a little twinge of pain in his left lower back but it went away.  This morning when he woke up he walked a little bit and had a sudden onset of pain in his left lower back that radiates all the way down his left leg.  He has some tingling in his toes and his leg feels weaker than normal.  He has no loss of bowel or bladder control.  No abdominal pain.  No nausea or vomiting.  He has not taken anything at home for the pain.  No prior history of back problems.  No recent falls or trauma.     History reviewed. No pertinent past medical history.  There are no active problems to display for this patient.   Past Surgical History:  Procedure Laterality Date  . TONSILLECTOMY          Home Medications    Prior to Admission medications   Medication Sig Start Date End Date Taking? Authorizing Provider  cyclobenzaprine (FLEXERIL) 10 MG tablet Take 1 tablet (10 mg total) by mouth 2 (two) times daily as needed for muscle spasms. 07/19/18   Rolan Bucco, MD  dicyclomine (BENTYL) 20 MG tablet Take 1 tablet (20 mg total) by mouth 2 (two) times daily. 03/04/18   Maxwell Caul, PA-C  naproxen (NAPROSYN) 500 MG tablet Take 1 tablet (500 mg total) by mouth 2 (two) times daily. 07/19/18   Rolan Bucco, MD  ondansetron (ZOFRAN) 4 MG tablet Take 1 tablet (4 mg total) by mouth every 6 (six) hours. 03/04/18   Maxwell Caul, PA-C  oseltamivir (TAMIFLU) 75 MG capsule Take 1 capsule (75 mg total) by mouth every 12 (twelve) hours. 03/04/18   Maxwell Caul, PA-C    Family History No  family history on file.  Social History Social History   Tobacco Use  . Smoking status: Never Smoker  . Smokeless tobacco: Never Used  Substance Use Topics  . Alcohol use: No  . Drug use: No     Allergies   Rocephin [ceftriaxone]   Review of Systems Review of Systems  Constitutional: Negative for fever.  Gastrointestinal: Negative for nausea and vomiting.  Musculoskeletal: Positive for back pain. Negative for arthralgias, joint swelling and neck pain.  Skin: Negative for wound.  Neurological: Positive for weakness and numbness. Negative for headaches.     Physical Exam Updated Vital Signs BP 136/89   Pulse 79   Temp 97.7 F (36.5 C) (Oral)   Resp 18   Ht 5\' 10"  (1.778 m)   Wt 113.4 kg (250 lb)   SpO2 92%   BMI 35.87 kg/m   Physical Exam  Constitutional: He is oriented to person, place, and time. He appears well-developed and well-nourished.  HENT:  Head: Normocephalic and atraumatic.  Neck: Normal range of motion. Neck supple.  Cardiovascular: Normal rate.  Pulmonary/Chest: Effort normal.  Musculoskeletal: He exhibits tenderness. He exhibits no edema.  Patient has tenderness to his left lower lumbar paraspinal area around L4-L5.  Positive straight  leg raise on the left.  Patellar reflexes are symmetric bilaterally.  He has normal sensation to light touch bilaterally.  He has normal flexion and extension of the foot and lower leg.  Flexion of the hip is limited due to his discomfort.  Neurological: He is alert and oriented to person, place, and time.  Skin: Skin is warm and dry.  Psychiatric: He has a normal mood and affect.     ED Treatments / Results  Labs (all labs ordered are listed, but only abnormal results are displayed) Labs Reviewed - No data to display  EKG None  Radiology No results found.  Procedures Procedures (including critical care time)  Medications Ordered in ED Medications  morphine 4 MG/ML injection 8 mg (8 mg Intramuscular  Given 07/19/18 1113)  ketorolac (TORADOL) 30 MG/ML injection 30 mg (30 mg Intramuscular Given 07/19/18 1112)  dexamethasone (DECADRON) injection 10 mg (10 mg Intramuscular Given 07/19/18 1113)     Initial Impression / Assessment and Plan / ED Course  I have reviewed the triage vital signs and the nursing notes.  Pertinent labs & imaging results that were available during my care of the patient were reviewed by me and considered in my medical decision making (see chart for details).     Patient is a 27 year old male who presents with radicular left-sided back pain.  He was treated in the ED with Decadron, morphine and Toradol.  He feels much better and is palliating is well controlled.  He has full motor function in his leg without neurologic deficits.  There is no suggestions of cauda equina.  No recent trauma that would warrant imaging studies.  He has an appointment to follow-up tomorrow with his PCP.  He was given prescriptions for Naprosyn and Flexeril.  Return precautions were given.  Final Clinical Impressions(s) / ED Diagnoses   Final diagnoses:  Acute left-sided low back pain with left-sided sciatica    ED Discharge Orders        Ordered    naproxen (NAPROSYN) 500 MG tablet  2 times daily     07/19/18 1215    cyclobenzaprine (FLEXERIL) 10 MG tablet  2 times daily PRN     07/19/18 1215       Rolan BuccoBelfi, Eldrige Pitkin, MD 07/19/18 1216

## 2018-07-19 NOTE — ED Triage Notes (Signed)
L lower back pain radiating down leg after getting out of bed this morning.

## 2018-07-20 MED FILL — CYCLOBENZAPRINE HCL 10 MG T: 10 | 10 days supply | Qty: 20 | Fill #0

## 2018-07-20 MED FILL — NAPROXEN 500 MG TABLET: 500 | 15 days supply | Qty: 30 | Fill #0

## 2019-04-02 IMAGING — CT CT ABD-PELV W/ CM
2 of 7 series · 14 of 46 positions shown, 18 images · IV contrast (APPLIED)
Comparison: Report 06/08/2014

CLINICAL DATA: Generalized abdominal pain with nausea vomiting and
diarrhea

EXAM:
CT ABDOMEN AND PELVIS WITH CONTRAST
TECHNIQUE: Multidetector CT imaging of the abdomen and pelvis was performed
using the standard protocol following bolus administration of
intravenous contrast.
CONTRAST:  100mL 5JTGTO-HII IOPAMIDOL (5JTGTO-HII) INJECTION 61%

[Series 2: axial st · axial · 0.98mm/px · z∈[-532,-92]mm · 11 of 105 slices shown, 15 images]
[im 11/105  soft-tissue]
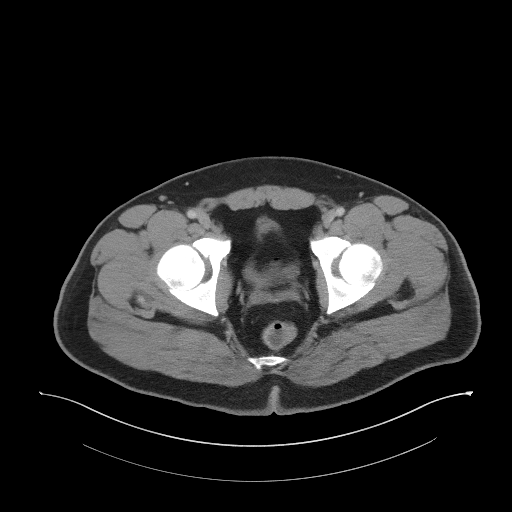
[im 11/105  bone]
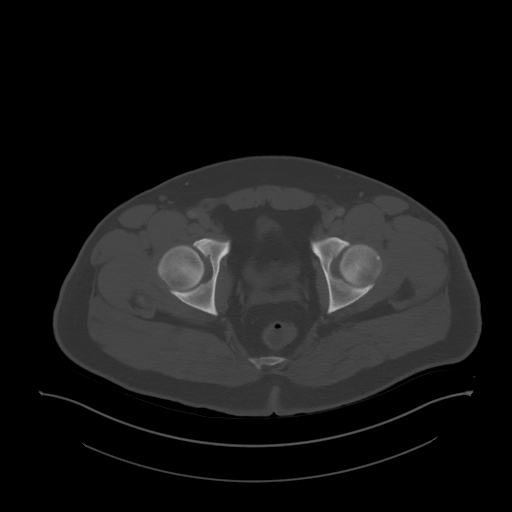
[im 22/105  soft-tissue]
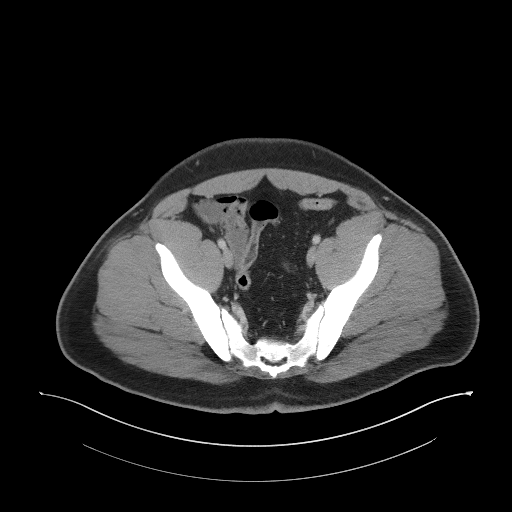
[im 33/105  soft-tissue]
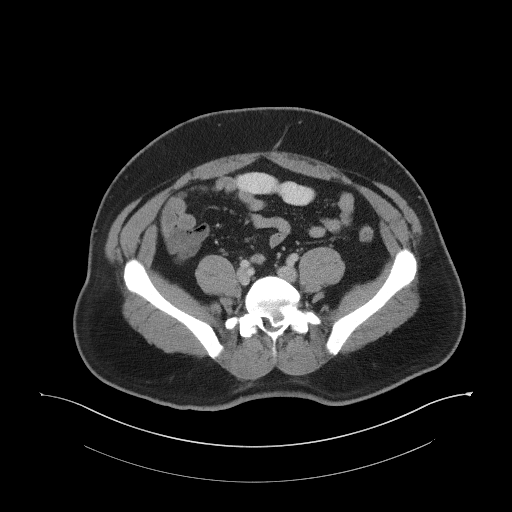
[im 44/105  soft-tissue]
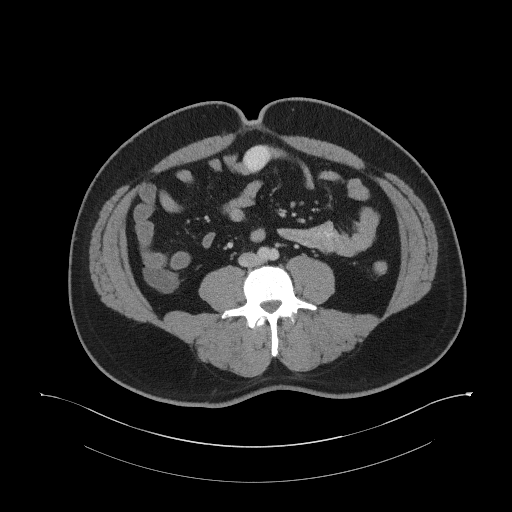
[im 55/105  soft-tissue]
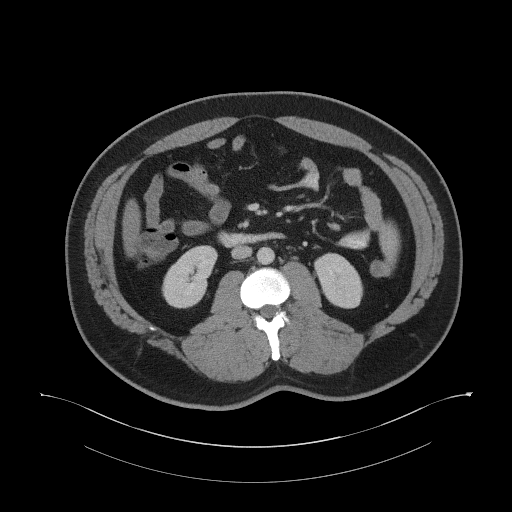
[im 66/105  soft-tissue]
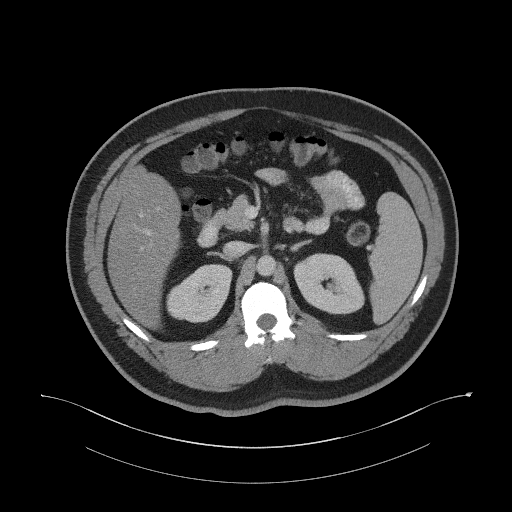
[im 77/105  soft-tissue]
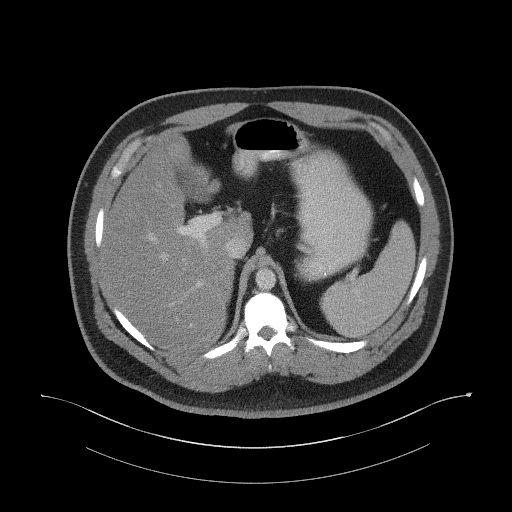
[im 83/105  lung]
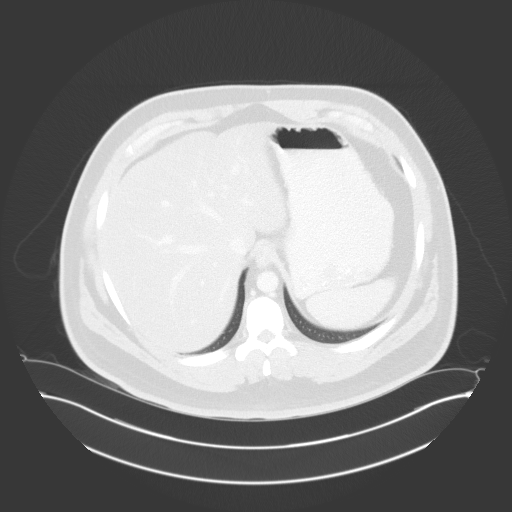
[im 88/105  soft-tissue]
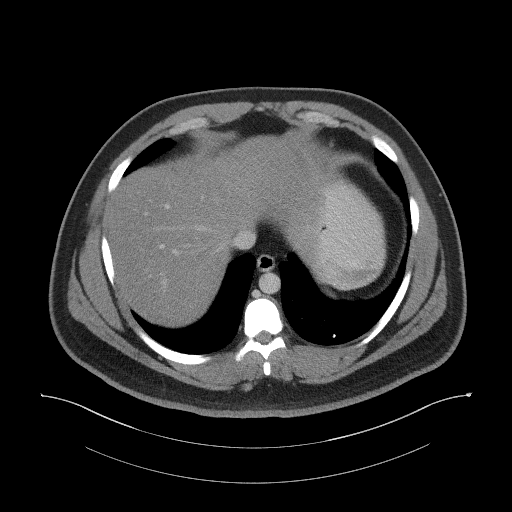
[im 88/105  lung]
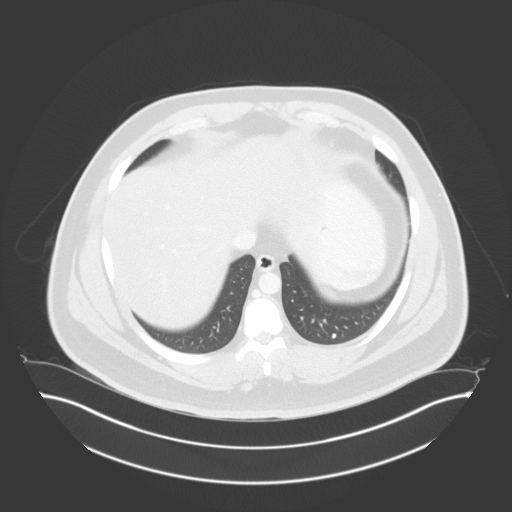
[im 94/105  lung]
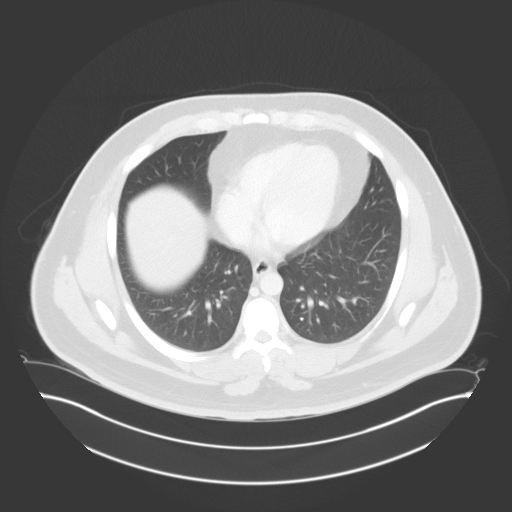
[im 99/105  soft-tissue]
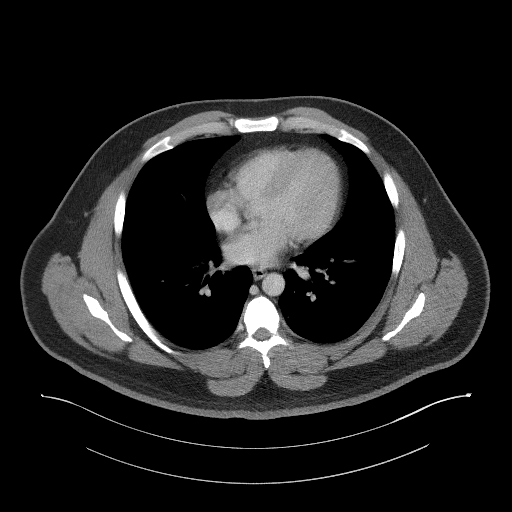
[im 99/105  lung]
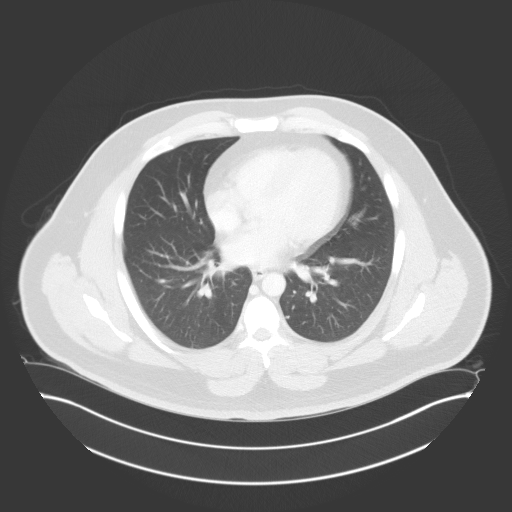
[im 99/105  bone]
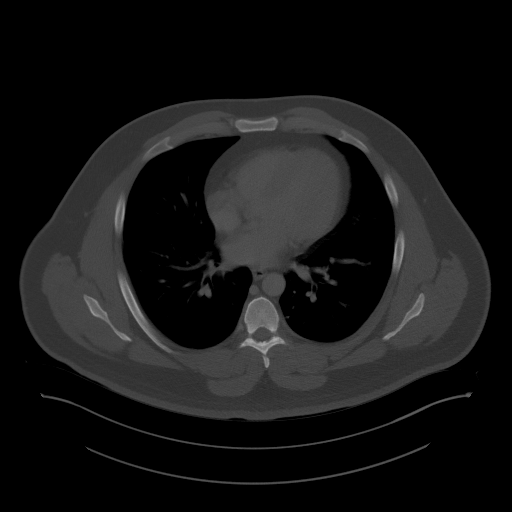

[Series 5: coronal st · coronal · 0.81mm/px · 3 of 103 slices shown]
[im 26/103  soft-tissue]
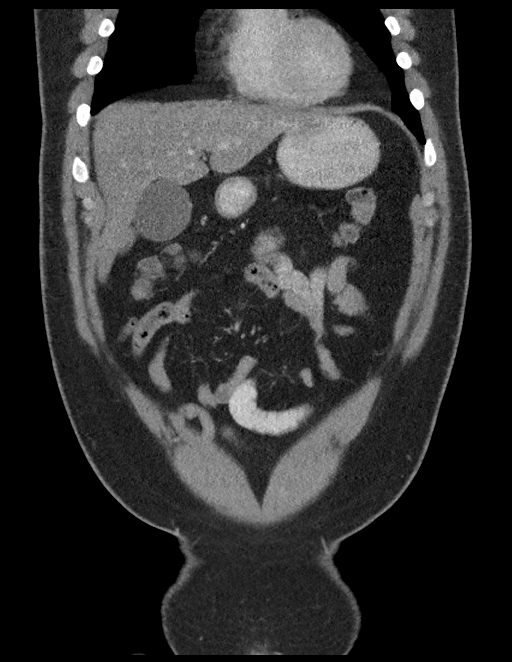
[im 52/103  soft-tissue]
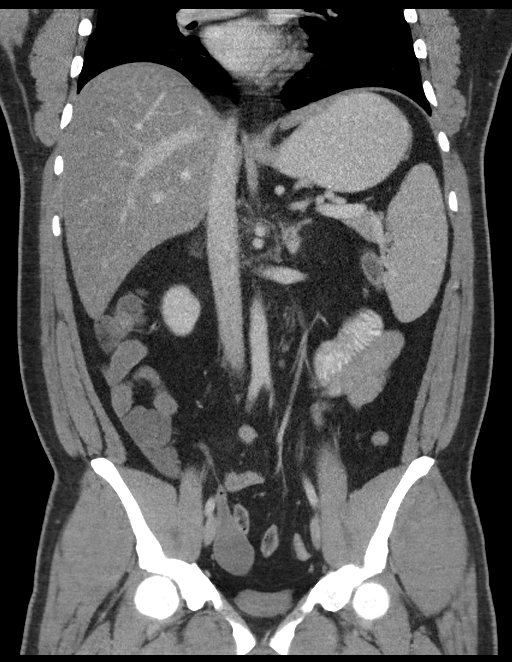
[im 77/103  soft-tissue]
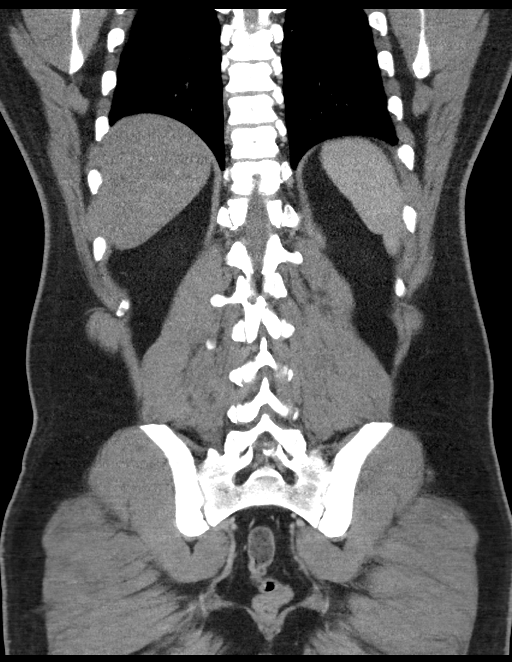

[14 of 46 positions shown; findings below may reference images not displayed]

FINDINGS: Lower chest: No acute consolidation or pleural effusion. Multiple
tiny pulmonary nodules in the left lower lobe possibly post
infectious or inflammatory. Normal heart size.

Hepatobiliary: Hepatic steatosis. No calcified gallstones or biliary
dilatation

Pancreas: Unremarkable. No pancreatic ductal dilatation or
surrounding inflammatory changes.

Spleen: Normal in size without focal abnormality.

Adrenals/Urinary Tract: Adrenal glands are unremarkable. Kidneys are
normal, without renal calculi, focal lesion, or hydronephrosis.
Bladder is unremarkable.

Stomach/Bowel: Stomach is nonenlarged. No dilated small bowel. No
colon wall thickening. Appendix not well seen but no right lower
quadrant inflammatory process.

Vascular/Lymphatic: No significant vascular findings are present. No
enlarged abdominal or pelvic lymph nodes.

Reproductive: Prostate is unremarkable.

Other: Negative for free air or free fluid. Small fat in the left
inguinal canal.

Musculoskeletal: Disc space narrowing at L5-S1 with large posterior
osteophyte and marked left greater than right foraminal stenosis.
Narrowing of the canal at this level.
IMPRESSION: 1. No CT evidence for acute intra-abdominal or pelvic abnormality.
2. Hepatic steatosis
3. Disc space disease at L5-S1 with prominent posterior osteophyte,
resulting in narrowing of the canal and marked bilateral foraminal
narrowing.

## 2019-09-22 ENCOUNTER — Encounter (HOSPITAL_BASED_OUTPATIENT_CLINIC_OR_DEPARTMENT_OTHER): Payer: Self-pay | Admitting: Emergency Medicine

## 2019-09-22 ENCOUNTER — Emergency Department (HOSPITAL_BASED_OUTPATIENT_CLINIC_OR_DEPARTMENT_OTHER)
Admission: EM | Admit: 2019-09-22 | Discharge: 2019-09-22 | Disposition: A | Discharge status: Home / Self Care | Payer: BC Managed Care – PPO | Attending: Emergency Medicine | Admitting: Emergency Medicine

## 2019-09-22 ENCOUNTER — Other Ambulatory Visit: Payer: Self-pay

## 2019-09-22 DIAGNOSIS — I10 Essential (primary) hypertension: Secondary | ICD-10-CM | POA: Insufficient documentation

## 2019-09-22 DIAGNOSIS — Z79899 Other long term (current) drug therapy: Secondary | ICD-10-CM | POA: Diagnosis not present

## 2019-09-22 DIAGNOSIS — R05 Cough: Secondary | ICD-10-CM | POA: Diagnosis present

## 2019-09-22 DIAGNOSIS — Z20828 Contact with and (suspected) exposure to other viral communicable diseases: Secondary | ICD-10-CM

## 2019-09-22 DIAGNOSIS — J069 Acute upper respiratory infection, unspecified: Secondary | ICD-10-CM | POA: Insufficient documentation

## 2019-09-22 DIAGNOSIS — Z20822 Contact with and (suspected) exposure to covid-19: Secondary | ICD-10-CM

## 2019-09-22 NOTE — Discharge Instructions (Signed)
He was seen in the emergency department for upper respiratory symptoms.  This is likely viral and you will need to treat symptomatically with decongestants, cough medicine, Tylenol and ibuprofen, fluids, and rest.  We are also testing you for Covid.  These results should be back in the next 1 to 2 days.  You should isolate until results of your testing.  Please return to the emergency department if any acute worsening of your symptoms.

## 2019-09-22 NOTE — ED Triage Notes (Signed)
Pt states cough, congestion, subjective fever, fatigue, beginning yesterday.

## 2019-09-22 NOTE — ED Provider Notes (Signed)
[place] EMERGENCY DEPARTMENT Provider Note   CSN: [id number] Arrival date & time: 09/22/19  0810     History   Chief Complaint Chief Complaint  Patient presents with  . Cough  . Fever    HPI Matthew Woods is a 28 y.o. male.  He is here with an upper respiratory symptoms that started yesterday.  He is complaining of nasal congestion subjective fevers generalized fatigue nonproductive cough.  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  Work sent him home today because of his symptoms and he has to get a Covid test.  Denies any body aches vomiting diarrhea loss of smell or taste.     The history is provided by the patient.  Fever Associated symptoms: congestion, cough, headaches and rhinorrhea   Associated symptoms: no chest pain, no dysuria, no myalgias, no nausea and no rash   URI Presenting symptoms: congestion, cough, fatigue, fever and rhinorrhea   Severity:  Moderate Onset quality:  Gradual Duration:  12 hours Timing:  Intermittent Progression:  Unchanged Chronicity:  New Relieved by:  Nothing Worsened by:  Nothing Ineffective treatments:  Hot fluids Associated symptoms: headaches, sinus pain and sneezing   Associated symptoms: no arthralgias, no myalgias, no neck pain, no swollen glands and no wheezing   Risk factors: no recent illness, no recent travel and no sick contacts     History reviewed. No pertinent past medical history.  There are no active problems to display for this patient.   Past Surgical History:  Procedure Laterality Date  . TONSILLECTOMY          Home Medications    Prior to Admission medications   Medication Sig Start Date End Date Taking? Authorizing Provider  cyclobenzaprine (FLEXERIL) 10 MG tablet Take 1 tablet (10 mg total) by mouth 2 (two) times daily as needed for muscle spasms. 07/19/18   Malvin Johns, MD  dicyclomine (BENTYL) 20 MG tablet Take 1 tablet (20 mg total) by  mouth 2 (two) times daily. 03/04/18   Volanda Napoleon, PA-C  naproxen (NAPROSYN) 500 MG tablet Take 1 tablet (500 mg total) by mouth 2 (two) times daily. 07/19/18   Malvin Johns, MD  ondansetron (ZOFRAN) 4 MG tablet Take 1 tablet (4 mg total) by mouth every 6 (six) hours. 03/04/18   Volanda Napoleon, PA-C  oseltamivir (TAMIFLU) 75 MG capsule Take 1 capsule (75 mg total) by mouth every 12 (twelve) hours. 03/04/18   Volanda Napoleon, PA-C    Family History No family history on file.  Social History Social History   Tobacco Use  . Smoking status: Never Smoker  . Smokeless tobacco: Never Used  Substance Use Topics  . Alcohol use: No  . Drug use: No     Allergies   Rocephin [ceftriaxone]   Review of Systems Review of Systems  Constitutional: Positive for fatigue and fever.  HENT: Positive for congestion, rhinorrhea, sinus pain and sneezing.   Eyes: Negative for visual disturbance.  Respiratory: Positive for cough. Negative for wheezing.   Cardiovascular: Negative for chest pain.  Gastrointestinal: Negative for nausea.  Genitourinary: Negative for dysuria.  Musculoskeletal: Negative for arthralgias, myalgias and neck pain.  Skin: Negative for rash.  Neurological: Positive for headaches.     Physical Exam Updated Vital Signs BP (!) 160/90 (BP Location: Right Arm)   Pulse 92   Temp 98.3 F (36.8 C) (Oral)   Resp 16   Ht 5'  9" (1.753 m)   Wt 111.1 kg   SpO2 99%   BMI 36.18 kg/m   Physical Exam Vitals signs and nursing note reviewed.  Constitutional:      Appearance: He is well-developed.  HENT:     Head: Normocephalic and atraumatic.     Nose: Congestion present.     Mouth/Throat:     Mouth: Mucous membranes are moist.     Pharynx: Oropharynx is clear.  Eyes:     Conjunctiva/sclera: Conjunctivae normal.     Pupils: Pupils are equal, round, and reactive to light.  Neck:     Musculoskeletal: Neck supple.  Cardiovascular:     Rate and Rhythm: Normal rate and  regular rhythm.     Heart sounds: No murmur.  Pulmonary:     Effort: Pulmonary effort is normal. No respiratory distress.     Breath sounds: Normal breath sounds.  Abdominal:     Palpations: Abdomen is soft.     Tenderness: There is no abdominal tenderness.  Skin:    General: Skin is warm and dry.  Neurological:     General: No focal deficit present.     Mental Status: He is alert.      ED Treatments / Results  Labs (all labs ordered are listed, but only abnormal results are displayed) Labs Reviewed  NOVEL CORONAVIRUS, NAA (HOSP ORDER, SEND-OUT TO REF LAB; TAT 18-24 HRS)    EKG None  Radiology No results found.  Procedures Procedures (including critical care time)  Medications Ordered in ED Medications - No data to display   Initial Impression / Assessment and Plan / ED Course  I have reviewed the triage vital signs and the nursing notes.  Pertinent labs & imaging results that were available during my care of the patient were reviewed by me and considered in my medical decision making (see chart for details).  Clinical Course as of Sep 22 1627  Wed Sep 22, 2019  3536 28 year old male here with upper respiratory infection symptoms.  Differential includes viral URI, Covid, pneumonia, bronchitis.  Sats 99% afebrile here with clear lung sounds.  Likely viral in nature currently.  Will order Covid outpatient testing as needs this for work.  Otherwise we talked about symptomatic treatment with decongestants fluids Tylenol ibuprofen.  He understands to monitor his symptoms and return if any acute worsening.  His blood pressure is also moderately high here.  He says it is usually only elevated when he second he monitors it at home.   [MB]    Clinical Course User Index [MB] Terrilee Files, MD   Damarkus Balis was evaluated in Emergency Department on 09/22/2019 for the symptoms described in the history of present illness. He was evaluated in the context of the global  COVID-19 pandemic, which necessitated consideration that the patient might be at risk for infection with the SARS-CoV-2 virus that causes COVID-19. Institutional protocols and algorithms that pertain to the evaluation of patients at risk for COVID-19 are in a state of rapid change based on information released by regulatory bodies including the CDC and federal and state organizations. These policies and algorithms were followed during the patient's care in the ED.       Final Clinical Impressions(s) / ED Diagnoses   Final diagnoses:  Upper respiratory tract infection, unspecified type  Hypertension, unspecified type  Person under investigation for COVID-19    ED Discharge Orders    None       Terrilee Files, MD  09/22/19 1629  

## 2019-09-23 LAB — NOVEL CORONAVIRUS, NAA (HOSP ORDER, SEND-OUT TO REF LAB; TAT 18-24 HRS): SARS-CoV-2, NAA: NOT DETECTED

## 2020-05-21 ENCOUNTER — Emergency Department (HOSPITAL_BASED_OUTPATIENT_CLINIC_OR_DEPARTMENT_OTHER)
Admission: EM | Admit: 2020-05-21 | Discharge: 2020-05-22 | Disposition: A | Discharge status: Home / Self Care | Payer: PRIVATE HEALTH INSURANCE | Attending: Emergency Medicine | Admitting: Emergency Medicine

## 2020-05-21 ENCOUNTER — Encounter (HOSPITAL_BASED_OUTPATIENT_CLINIC_OR_DEPARTMENT_OTHER): Payer: Self-pay | Admitting: Emergency Medicine

## 2020-05-21 ENCOUNTER — Other Ambulatory Visit: Payer: Self-pay

## 2020-05-21 ENCOUNTER — Emergency Department (HOSPITAL_BASED_OUTPATIENT_CLINIC_OR_DEPARTMENT_OTHER): Payer: PRIVATE HEALTH INSURANCE

## 2020-05-21 DIAGNOSIS — Z881 Allergy status to other antibiotic agents status: Secondary | ICD-10-CM | POA: Insufficient documentation

## 2020-05-21 DIAGNOSIS — R030 Elevated blood-pressure reading, without diagnosis of hypertension: Secondary | ICD-10-CM | POA: Insufficient documentation

## 2020-05-21 DIAGNOSIS — Z20822 Contact with and (suspected) exposure to covid-19: Secondary | ICD-10-CM | POA: Insufficient documentation

## 2020-05-21 DIAGNOSIS — J189 Pneumonia, unspecified organism: Secondary | ICD-10-CM | POA: Insufficient documentation

## 2020-05-21 LAB — CBC WITH DIFFERENTIAL/PLATELET
Abs Immature Granulocytes: 0.06 10*3/uL (ref 0.00–0.07)
Basophils Absolute: 0.1 10*3/uL (ref 0.0–0.1)
Basophils Relative: 1 %
Eosinophils Absolute: 0.3 10*3/uL (ref 0.0–0.5)
Eosinophils Relative: 3 %
HCT: 46 % (ref 39.0–52.0)
Hemoglobin: 15.3 g/dL (ref 13.0–17.0)
Immature Granulocytes: 1 %
Lymphocytes Relative: 30 %
Lymphs Abs: 2.9 10*3/uL (ref 0.7–4.0)
MCH: 29.3 pg (ref 26.0–34.0)
MCHC: 33.3 g/dL (ref 30.0–36.0)
MCV: 88 fL (ref 80.0–100.0)
Monocytes Absolute: 0.9 10*3/uL (ref 0.1–1.0)
Monocytes Relative: 9 %
Neutro Abs: 5.3 10*3/uL (ref 1.7–7.7)
Neutrophils Relative %: 56 %
Platelets: 242 10*3/uL (ref 150–400)
RBC: 5.23 MIL/uL (ref 4.22–5.81)
RDW: 11.9 % (ref 11.5–15.5)
WBC: 9.5 10*3/uL (ref 4.0–10.5)
nRBC: 0 % (ref 0.0–0.2)

## 2020-05-21 LAB — COMPREHENSIVE METABOLIC PANEL
ALT: 58 U/L — ABNORMAL HIGH (ref 0–44)
AST: 33 U/L (ref 15–41)
Albumin: 4 g/dL (ref 3.5–5.0)
Alkaline Phosphatase: 112 U/L (ref 38–126)
Anion gap: 10 (ref 5–15)
BUN: 16 mg/dL (ref 6–20)
CO2: 28 mmol/L (ref 22–32)
Calcium: 9.4 mg/dL (ref 8.9–10.3)
Chloride: 104 mmol/L (ref 98–111)
Creatinine, Ser: 1.21 mg/dL (ref 0.61–1.24)
GFR calc Af Amer: >60 mL/min (ref >60)
GFR calc non Af Amer: >60 mL/min (ref >60)
Glucose, Bld: 94 mg/dL (ref 70–99)
Potassium: 4.2 mmol/L (ref 3.5–5.1)
Sodium: 142 mmol/L (ref 135–145)
Total Bilirubin: 0.5 mg/dL (ref 0.3–1.2)
Total Protein: 7.6 g/dL (ref 6.5–8.1)

## 2020-05-21 LAB — SARS CORONAVIRUS 2 BY RT PCR (HOSPITAL ORDER, PERFORMED IN ~~LOC~~ HOSPITAL LAB): SARS Coronavirus 2: NEGATIVE

## 2020-05-21 LAB — D-DIMER, QUANTITATIVE: D-Dimer, Quant: <0.27 ug/mL-FEU (ref 0.00–0.50)

## 2020-05-21 LAB — TROPONIN I (HIGH SENSITIVITY): Troponin I (High Sensitivity): 9 ng/L (ref <18)

## 2020-05-21 MED ORDER — BENZONATATE 100 MG PO CAPS
100.0000 mg | ORAL_CAPSULE | Freq: Three times a day (TID) | ORAL | 0 refills | Status: DC
Start: 2020-05-21 — End: 2023-01-01

## 2020-05-21 MED ORDER — PREDNISONE 50 MG PO TABS
60.0000 mg | ORAL_TABLET | Freq: Once | ORAL | Status: AC
  Administered 2020-05-22: 60 mg via ORAL
  Filled 2020-05-21: qty 1

## 2020-05-21 MED ORDER — KETOROLAC TROMETHAMINE 30 MG/ML IJ SOLN
30.0000 mg | Freq: Once | INTRAMUSCULAR | Status: AC
  Administered 2020-05-22: 30 mg via INTRAVENOUS
  Filled 2020-05-21: qty 1

## 2020-05-21 MED ORDER — PREDNISONE 20 MG PO TABS
40.0000 mg | ORAL_TABLET | Freq: Every day | ORAL | 0 refills | Status: AC
Start: 2020-05-21 — End: 2020-05-25

## 2020-05-21 MED ORDER — DOXYCYCLINE HYCLATE 100 MG PO CAPS
100.0000 mg | ORAL_CAPSULE | Freq: Two times a day (BID) | ORAL | 0 refills | Status: AC
Start: 2020-05-21 — End: 2020-05-28

## 2020-05-21 MED ORDER — DOXYCYCLINE HYCLATE 100 MG PO TABS
100.0000 mg | ORAL_TABLET | Freq: Once | ORAL | Status: AC
  Administered 2020-05-22: 100 mg via ORAL
  Filled 2020-05-21: qty 1

## 2020-05-21 NOTE — ED Notes (Signed)
Able to speak full sentences. Airway intact. RR even WNL.

## 2020-05-21 NOTE — ED Provider Notes (Signed)
[place] EMERGENCY DEPARTMENT Provider Note   CSN: [id number] Arrival date & time: 05/21/20  2118     History Chief Complaint  Patient presents with  . cough, fever    Matthew Woods is a 29 y.o. male who presents for evaluation of persistent cough, fever, fatigue that has been ongoing for the last 4 days.  He states that when initial symptoms started, he went to [facility] and was evaluated.  His work-up was unremarkable.  He reports that since then, he has continued to have symptoms.  He reports persistent cough.  He states that the cough is productive with phlegm.  No blood.  He states he has chest pain that hurts when he coughs.  He states that when he does not have coughing, he does not have chest pain.  He also reports he has had subjective fever and chills at home.  He states that he has had good appetite but has felt very tired and fatigued feeling like he has no energy.  He also feels like he has been having worsening shortness of breath for the last 4 days.  He states that whenever he gets up and walks around, he has to stop because he gets winded.  He reports that today, when he walked from his truck to his work, he had to stop because he could not breathe.  He does not smoke and denies any history of COPD or asthma.  He states occasionally he will have pleuritic chest pain but this is not consistent.  He did not get Covid vaccinated.  No recent sick contacts or travel.  He denies any abdominal pain, nausea/vomiting.  No personal cardiac history.  No family history of MI before the age of 92. He denies any exogenous hormone use, recent immobilization, prior history of DVT/PE, recent surgery, leg swelling, or long travel.  The history is provided by the patient.       History reviewed. No pertinent past medical history.  There are no problems to display for this patient.   Past Surgical History:  Procedure Laterality Date  . TONSILLECTOMY         No  family history on file.  Social History   Tobacco Use  . Smoking status: Never Smoker  . Smokeless tobacco: Never Used  Substance Use Topics  . Alcohol use: No  . Drug use: No    Home Medications Prior to Admission medications   Medication Sig Start Date End Date Taking? Authorizing Provider  benzonatate (TESSALON) 100 MG capsule Take 1 capsule (100 mg total) by mouth every 8 (eight) hours. 05/21/20   Volanda Napoleon, PA-C  benzonatate (TESSALON) 100 MG capsule Take 1 capsule (100 mg total) by mouth every 8 (eight) hours. 05/21/20   Volanda Napoleon, PA-C  cyclobenzaprine (FLEXERIL) 10 MG tablet Take 1 tablet (10 mg total) by mouth 2 (two) times daily as needed for muscle spasms. 07/19/18   Malvin Johns, MD  dicyclomine (BENTYL) 20 MG tablet Take 1 tablet (20 mg total) by mouth 2 (two) times daily. 03/04/18   Volanda Napoleon, PA-C  doxycycline (VIBRAMYCIN) 100 MG capsule Take 1 capsule (100 mg total) by mouth 2 (two) times daily for 7 days. 05/21/20 05/28/20  Volanda Napoleon, PA-C  naproxen (NAPROSYN) 500 MG tablet Take 1 tablet (500 mg total) by mouth 2 (two) times daily. 07/19/18   Malvin Johns, MD  ondansetron (ZOFRAN) 4 MG tablet Take 1 tablet (4 mg total) by mouth  every 6 (six) hours. 03/04/18   Maxwell Caul, PA-C  predniSONE (DELTASONE) 20 MG tablet Take 2 tablets (40 mg total) by mouth daily for 4 days. 05/21/20 05/25/20  Maxwell Caul, PA-C    Allergies    Rocephin [ceftriaxone]  Review of Systems   Review of Systems  Constitutional: Positive for fatigue and fever.  Respiratory: Positive for cough and shortness of breath.   Cardiovascular: Negative for chest pain and leg swelling.  Gastrointestinal: Negative for abdominal pain, nausea and vomiting.  Genitourinary: Negative for dysuria and hematuria.  Neurological: Negative for headaches.  All other systems reviewed and are negative.   Physical Exam Updated Vital Signs BP (!) 153/88   Pulse 86   Temp 98 F (36.7  C) (Oral)   Resp (!) 21   Ht 5\' 9"  (1.753 m)   Wt 116.6 kg   SpO2 96%   BMI 37.95 kg/m   Physical Exam Vitals and nursing note reviewed.  Constitutional:      Appearance: Normal appearance. He is well-developed.  HENT:     Head: Normocephalic and atraumatic.  Eyes:     General: Lids are normal.     Conjunctiva/sclera: Conjunctivae normal.     Pupils: Pupils are equal, round, and reactive to light.  Cardiovascular:     Rate and Rhythm: Normal rate and regular rhythm.     Pulses: Normal pulses.     Heart sounds: Normal heart sounds. No murmur. No friction rub. No gallop.   Pulmonary:     Effort: Pulmonary effort is normal.     Breath sounds: Normal breath sounds.     Comments: Lungs clear to auscultation bilaterally.  Symmetric chest rise.  No wheezing, rales, rhonchi.  No evidence of respiratory distress. Abdominal:     Palpations: Abdomen is soft. Abdomen is not rigid.     Tenderness: There is no abdominal tenderness. There is no guarding.     Comments: Abdomen is soft, non-distended, non-tender. No rigidity, No guarding. No peritoneal signs.  Musculoskeletal:        General: Normal range of motion.     Cervical back: Full passive range of motion without pain.     Comments: Bilateral lower extremities are symmetric in appearance without any overlying warmth, erythema, edema.  Skin:    General: Skin is warm and dry.     Capillary Refill: Capillary refill takes less than 2 seconds.  Neurological:     Mental Status: He is alert and oriented to person, place, and time.  Psychiatric:        Speech: Speech normal.     ED Results / Procedures / Treatments   Labs (all labs ordered are listed, but only abnormal results are displayed) Labs Reviewed  COMPREHENSIVE METABOLIC PANEL - Abnormal; Notable for the following components:      Result Value   ALT 58 (*)    All other components within normal limits  SARS CORONAVIRUS 2 BY RT PCR (HOSPITAL ORDER, PERFORMED IN [facility])  CBC WITH DIFFERENTIAL/PLATELET  D-DIMER, QUANTITATIVE (NOT AT [facility])  TROPONIN I (HIGH SENSITIVITY)  TROPONIN I (HIGH SENSITIVITY)    EKG EKG Interpretation  Date/Time:  Sunday May 21 2020 22:20:20 EDT Ventricular Rate:  84 PR Interval:    QRS Duration: 88 QT Interval:  360 QTC Calculation: 426 R Axis:   40 Text Interpretation: Sinus rhythm Confirmed by [id number] (Ross Marcus) on 05/22/2020 12:01:48 AM   Radiology DG Chest Portable 1 View  Result Date: 05/21/2020 CLINICAL DATA:  Cough and sore throat.  Body aches.  Fever. EXAM: PORTABLE CHEST 1 VIEW COMPARISON:  Chest radiograph 05/17/2020 FINDINGS: The cardiomediastinal contours are normal. Minimal vague right suprahilar opacity, new from prior. Pulmonary vasculature is normal. No consolidation, pleural effusion, or pneumothorax. No acute osseous abnormalities are seen. IMPRESSION: Minimal vague right suprahilar opacity, new from prior exam, may reflect atelectasis or pneumonia. Electronically Signed   By: Narda Rutherford M.D.   On: 05/21/2020 22:22    Procedures Procedures (including critical care time)  Medications Ordered in ED Medications  ketorolac (TORADOL) 30 MG/ML injection 30 mg (has no administration in time range)  predniSONE (DELTASONE) tablet 60 mg (has no administration in time range)  doxycycline (VIBRA-TABS) tablet 100 mg (has no administration in time range)    ED Course  I have reviewed the triage vital signs and the nursing notes.  Pertinent labs & imaging results that were available during my care of the patient were reviewed by me and considered in my medical decision making (see chart for details).    MDM Rules/Calculators/A&P                      29 year old male who presents for evaluation of 4 days of persistent cough, fatigue, subjective fever chills.  Also reports some chest pain but only occurs with coughing.  He also reports he has been having shortness of breath that is mostly  with exertion over the last few days.  On initially arrival, he is afebrile nontoxic-appearing.  Vital signs are stable.  He is slightly hypertensive.  Vitals otherwise stable.  No evidence of hypoxia or tachycardia.  Question infectious etiology.  Low suspicion for ACS etiology but also consideration.  He does not have any PE risk factors and is not hypoxic or tachycardic here but does report that he occasionally has some pleuritic chest pain has dyspnea on exertion.  We will plan for D-dimer, labs, EKG, chest x-ray.  Covid negative.  Troponin negative.  CBC shows no leukocytosis or anemia.  CMP is unremarkable.  D-dimer is negative.  Chest x-ray shows minimal opacity in the right subhilar region that is new from previous.  Given that he has persistent symptoms with new findings on chest x-ray, will plan to treat him.  Patient is not hypoxic here in the ED.  Given negative D-dimer, reassuring vital signs, do not suspect PE as etiology of his symptoms.  Additionally, he only reports chest pain with coughing.  Exam not concerning for ACS etiology.  Will start patient on antibiotics.  Discussed results with patient.  He has allergy to Rocephin. At this time, patient exhibits no emergent life-threatening condition that require further evaluation in ED or admission. Patient had ample opportunity for questions and discussion. All patient's questions were answered with full understanding. Strict return precautions discussed. Patient expresses understanding and agreement to plan.   Portions of this note were generated with Scientist, clinical (histocompatibility and immunogenetics). Dictation errors may occur despite best attempts at proofreading.   Final Clinical Impression(s) / ED Diagnoses Final diagnoses:  Community acquired pneumonia, unspecified laterality  Elevated blood pressure reading    Rx / DC Orders ED Discharge Orders         Ordered    predniSONE (DELTASONE) 20 MG tablet  Daily     05/21/20 2358    doxycycline  (VIBRAMYCIN) 100 MG capsule  2 times daily     05/21/20 2358    benzonatate (TESSALON)  100 MG capsule  Every 8 hours     05/21/20 2358    benzonatate (TESSALON) 100 MG capsule  Every 8 hours     05/21/20 2359           Maxwell Caul, PA-C 05/22/20 0006    Rolan Bucco, MD 06/03/20 0700

## 2020-05-21 NOTE — ED Triage Notes (Signed)
Cough, sore throat, body aches and fever since 05/17/20. Seen at [facility] for same. States he is not any better. Also reports SHOB and fatigue.

## 2020-05-21 NOTE — Discharge Instructions (Signed)
As we discussed, your chest x-ray showed a possible developing pneumonia that we have decided to treat since you are persistently having symptoms.  Take antibiotics as directed. Please take all of your antibiotics until finished.  Take prednisone as directed.  Take Tessalon Perles as needed for cough.  Your blood pressure was slightly elevated here in the emergency department today.  This could be because you are not feeling well but needs to be rechecked by your primary care doctor.

## 2020-12-20 ENCOUNTER — Encounter (HOSPITAL_BASED_OUTPATIENT_CLINIC_OR_DEPARTMENT_OTHER): Payer: Self-pay

## 2020-12-20 ENCOUNTER — Emergency Department (HOSPITAL_BASED_OUTPATIENT_CLINIC_OR_DEPARTMENT_OTHER)
Admission: EM | Admit: 2020-12-20 | Discharge: 2020-12-20 | Disposition: A | Discharge status: Home / Self Care | Payer: Self-pay | Attending: Emergency Medicine | Admitting: Emergency Medicine

## 2020-12-20 ENCOUNTER — Other Ambulatory Visit: Payer: Self-pay

## 2020-12-20 DIAGNOSIS — J069 Acute upper respiratory infection, unspecified: Secondary | ICD-10-CM | POA: Insufficient documentation

## 2020-12-20 DIAGNOSIS — Z5321 Procedure and treatment not carried out due to patient leaving prior to being seen by health care provider: Secondary | ICD-10-CM | POA: Insufficient documentation

## 2020-12-20 DIAGNOSIS — R6883 Chills (without fever): Secondary | ICD-10-CM | POA: Insufficient documentation

## 2020-12-20 NOTE — ED Triage Notes (Addendum)
Pt states he woke this am with body aches, chills URI [id number]-steady gait

## 2021-12-21 ENCOUNTER — Other Ambulatory Visit: Payer: Self-pay

## 2021-12-21 ENCOUNTER — Emergency Department (HOSPITAL_BASED_OUTPATIENT_CLINIC_OR_DEPARTMENT_OTHER)
Admission: EM | Admit: 2021-12-21 | Discharge: 2021-12-21 | Disposition: A | Discharge status: Home / Self Care | Payer: Self-pay | Attending: Emergency Medicine | Admitting: Emergency Medicine

## 2021-12-21 ENCOUNTER — Encounter (HOSPITAL_BASED_OUTPATIENT_CLINIC_OR_DEPARTMENT_OTHER): Payer: Self-pay | Admitting: *Deleted

## 2021-12-21 ENCOUNTER — Emergency Department (HOSPITAL_BASED_OUTPATIENT_CLINIC_OR_DEPARTMENT_OTHER): Payer: Self-pay

## 2021-12-21 DIAGNOSIS — I10 Essential (primary) hypertension: Secondary | ICD-10-CM | POA: Insufficient documentation

## 2021-12-21 DIAGNOSIS — Z20822 Contact with and (suspected) exposure to covid-19: Secondary | ICD-10-CM | POA: Insufficient documentation

## 2021-12-21 DIAGNOSIS — J069 Acute upper respiratory infection, unspecified: Secondary | ICD-10-CM | POA: Insufficient documentation

## 2021-12-21 LAB — RESP PANEL BY RT-PCR (FLU A&B, COVID) ARPGX2
Influenza A by PCR: NEGATIVE
Influenza B by PCR: NEGATIVE
SARS Coronavirus 2 by RT PCR: NEGATIVE

## 2021-12-21 NOTE — ED Triage Notes (Addendum)
Cough and fever today. BP taken x 3 with same results on both arms. Acuity increased and EKG ordered.

## 2021-12-21 NOTE — Discharge Instructions (Signed)
Follow-up with the primary care doctor to discuss your blood pressure management.  Avoid Sudafed or other decongestants that raise your blood pressure.  If you develop chest pain, difficulty breathing or other new concerning symptom, come back to ER for reassessment.

## 2021-12-21 NOTE — ED Provider Notes (Signed)
[facility]ENC[facility] Provider Note   CSN: [id number] Arrival date & time: 12/21/21  1840     History  Chief Complaint  Patient presents with   Cough   Fever    Matthew Woods is a 31 y.o. male.  Presented to the emergency room with concern for cough, fever.  Patient states that he has been having symptoms for the past day or so.  Denies any difficulty breathing, cough has been mostly nonproductive.  Has had some nasal congestion but not much.  Felt chills and feverish earlier today.  Also noted his blood pressure was elevated.  Notably did take Sudafed this morning.  States that he has known history of high blood pressure but does not have a primary care doctor and does not take blood pressure medicine regularly.  Completed chart review, reviewed [facility] ER visits, multiple blood pressure readings quite elevated ranging [id number] to [id number] systolic.  HPI     Home Medications Prior to Admission medications   Medication Sig Start Date End Date Taking? Authorizing Provider  benzonatate (TESSALON) 100 MG capsule Take 1 capsule (100 mg total) by mouth every 8 (eight) hours. 05/21/20   Volanda Napoleon, PA-C  benzonatate (TESSALON) 100 MG capsule Take 1 capsule (100 mg total) by mouth every 8 (eight) hours. 05/21/20   Volanda Napoleon, PA-C  cyclobenzaprine (FLEXERIL) 10 MG tablet Take 1 tablet (10 mg total) by mouth 2 (two) times daily as needed for muscle spasms. 07/19/18   Malvin Johns, MD  dicyclomine (BENTYL) 20 MG tablet Take 1 tablet (20 mg total) by mouth 2 (two) times daily. 03/04/18   Volanda Napoleon, PA-C  naproxen (NAPROSYN) 500 MG tablet Take 1 tablet (500 mg total) by mouth 2 (two) times daily. 07/19/18   Malvin Johns, MD  ondansetron (ZOFRAN) 4 MG tablet Take 1 tablet (4 mg total) by mouth every 6 (six) hours. 03/04/18   Volanda Napoleon, PA-C      Allergies    Rocephin [ceftriaxone]    Review of Systems   Review of Systems  Constitutional:  Positive for  chills and fever.  HENT:  Negative for ear pain and sore throat.   Eyes:  Negative for pain and visual disturbance.  Respiratory:  Positive for cough. Negative for shortness of breath.   Cardiovascular:  Negative for chest pain and palpitations.  Gastrointestinal:  Negative for abdominal pain and vomiting.  Genitourinary:  Negative for dysuria and hematuria.  Musculoskeletal:  Negative for arthralgias and back pain.  Skin:  Negative for color change and rash.  Neurological:  Positive for headaches. Negative for seizures and syncope.  All other systems reviewed and are negative.  Physical Exam Updated Vital Signs BP (!) 172/103 (BP Location: Right Arm)    Pulse 83    Temp 97.7 F (36.5 C) (Oral)    Resp 17    Ht 5\' 9"  (1.753 m)    Wt 117.9 kg    SpO2 98%    BMI 38.40 kg/m  Physical Exam Vitals and nursing note reviewed.  Constitutional:      General: He is not in acute distress.    Appearance: He is well-developed.  HENT:     Head: Normocephalic and atraumatic.  Eyes:     Conjunctiva/sclera: Conjunctivae normal.  Cardiovascular:     Rate and Rhythm: Normal rate and regular rhythm.     Heart sounds: No murmur heard. Pulmonary:     Effort: Pulmonary effort is normal. No  respiratory distress.     Breath sounds: Normal breath sounds.  Abdominal:     Palpations: Abdomen is soft.     Tenderness: There is no abdominal tenderness.  Musculoskeletal:        General: No swelling.     Cervical back: Neck supple.  Skin:    General: Skin is warm and dry.     Capillary Refill: Capillary refill takes less than 2 seconds.  Neurological:     General: No focal deficit present.     Mental Status: He is alert.  Psychiatric:        Mood and Affect: Mood normal.    ED Results / Procedures / Treatments   Labs (all labs ordered are listed, but only abnormal results are displayed) Labs Reviewed  RESP PANEL BY RT-PCR (FLU A&B, COVID) [id number]    EKG EKG Interpretation  Date/Time:  Friday  December 21 2021 19:08:58 EST Ventricular Rate:  71 PR Interval:  166 QRS Duration: 82 QT Interval:  406 QTC Calculation: 441 R Axis:   9 Text Interpretation: Normal sinus rhythm Normal ECG When compared with ECG of 21-May-2020 22:20, PREVIOUS ECG IS PRESENT Confirmed by Madalyn Rob [phone]) on 12/21/2021 7:42:42 PM  Radiology DG Chest 2 View  Result Date: 12/21/2021 CLINICAL DATA:  cough, short of breath, concern for pna EXAM: CHEST - 2 VIEW COMPARISON:  chest x-ray 05/21/2020, chest x-ray 05/17/2020 FINDINGS: The heart and mediastinal contours are unchanged. No focal consolidation. No pulmonary edema. No pleural effusion. No pneumothorax. No acute osseous abnormality. IMPRESSION: No active cardiopulmonary disease. Electronically Signed   By: Iven Finn M.D.   On: 12/21/2021 21:13    Procedures Procedures    Medications Ordered in ED Medications - No data to display  ED Course/ Medical Decision Making/ A&P                           Medical Decision Making  31 year old gentleman presenting to ER with concern for cough, fever, elevated blood pressure.  On on exam, patient well-appearing in no acute distress, lungs clear.  Chest x-ray negative for pneumonia or other acute process.  Per review of chart patient has known history of hypertension that is not well managed.  Did endorse taking Sudafed.  Suspect this exacerbated his underlying hypertension.  COVID, flu testing negative.  Suspect patient does have some other viral upper respiratory illness.  Given his well appearance, feel he can be managed in the outpatient setting.  Recommend he follow-up with primary care, does not currently have health insurance, provided information for [employer] and wellness and stressed importance of following up with her primary care doctor.  Reviewed return precautions and discharged.  After the discussed management above, the patient was determined to be safe for discharge.  The patient was in  agreement with this plan and all questions regarding their care were answered.  ED return precautions were discussed and the patient will return to the ED with any significant worsening of condition.         Final Clinical Impression(s) / ED Diagnoses Final diagnoses:  Viral upper respiratory illness  Hypertension, unspecified type    Rx / DC Orders ED Discharge Orders     None         Lucrezia Starch, MD 12/22/21 0003

## 2023-01-01 ENCOUNTER — Emergency Department (HOSPITAL_BASED_OUTPATIENT_CLINIC_OR_DEPARTMENT_OTHER)
Admission: EM | Admit: 2023-01-01 | Discharge: 2023-01-01 | Disposition: A | Discharge status: Home / Self Care | Payer: Self-pay | Attending: Emergency Medicine | Admitting: Emergency Medicine

## 2023-01-01 ENCOUNTER — Emergency Department (HOSPITAL_BASED_OUTPATIENT_CLINIC_OR_DEPARTMENT_OTHER): Payer: Self-pay

## 2023-01-01 ENCOUNTER — Encounter (HOSPITAL_BASED_OUTPATIENT_CLINIC_OR_DEPARTMENT_OTHER): Payer: Self-pay

## 2023-01-01 DIAGNOSIS — Z79899 Other long term (current) drug therapy: Secondary | ICD-10-CM | POA: Insufficient documentation

## 2023-01-01 DIAGNOSIS — I10 Essential (primary) hypertension: Secondary | ICD-10-CM | POA: Insufficient documentation

## 2023-01-01 DIAGNOSIS — J3489 Other specified disorders of nose and nasal sinuses: Secondary | ICD-10-CM | POA: Insufficient documentation

## 2023-01-01 DIAGNOSIS — L539 Erythematous condition, unspecified: Secondary | ICD-10-CM | POA: Insufficient documentation

## 2023-01-01 DIAGNOSIS — U071 COVID-19: Secondary | ICD-10-CM | POA: Insufficient documentation

## 2023-01-01 LAB — RESP PANEL BY RT-PCR (RSV, FLU A&B, COVID)  RVPGX2
Influenza A by PCR: NEGATIVE
Influenza B by PCR: NEGATIVE
Resp Syncytial Virus by PCR: NEGATIVE
SARS Coronavirus 2 by RT PCR: POSITIVE — AB

## 2023-01-01 MED ORDER — ACETAMINOPHEN 500 MG PO TABS: 500.0000 mg | ORAL_TABLET | Freq: Four times a day (QID) | ORAL | 0 refills | Status: AC | PRN

## 2023-01-01 MED ORDER — BENZONATATE 100 MG PO CAPS: 100.0000 mg | ORAL_CAPSULE | Freq: Three times a day (TID) | ORAL | 0 refills | Status: AC

## 2023-01-01 MED ORDER — PAXLOVID (300/100) 20 X 150 MG & 10 X 100MG PO TBPK: 3.0000 | ORAL_TABLET | Freq: Two times a day (BID) | ORAL | 0 refills | Status: AC

## 2023-01-01 NOTE — Discharge Instructions (Addendum)
Please pick up the medications that I prescribed.  Please continue to stay hydrated and rest as this will help in the recovery process.  I have also attached a work note for you to go back to work next Wednesday.  I have attached a [facility] for you to follow-up with in regards to recent ER visits if you do not have a primary care provider.  If symptoms worsen please return to ER.

## 2023-01-01 NOTE — ED Notes (Signed)
Discharge instructions reviewed with patient. Patient verbalizes understanding, no further questions at this time. Medications/prescriptions and follow up information provided. No acute distress noted at time of departure.  

## 2023-01-01 NOTE — ED Provider Notes (Signed)
[place] EMERGENCY DEPARTMENT Provider Note   CSN: [id number] Arrival date & time: 01/01/23  1601     History  Chief Complaint  Patient presents with   Cough    Matthew Woods is a 32 y.o. male with history of hypertension presented with 2 days of cough and shortness of breath.  Patient denied any sick contacts at home.  Patient stated cough is dry and that he is not coughing up any blood.  Patient states he had a fever at home of 99.9 F.  Patient endorsed headache as well that is bilateral and dull.  Patient does not take any meds at home and states he is relatively healthy.  Patient has not taken anything for the symptoms.  Patient denied chest pain, dysuria, abdominal pain, nausea/vomiting/diarrhea.  Home Medications Prior to Admission medications   Medication Sig Start Date End Date Taking? Authorizing Provider  acetaminophen (TYLENOL) 500 MG tablet Take 1 tablet (500 mg total) by mouth every 6 (six) hours as needed. 01/01/23  Yes Bernadean Saling, Florene Route, PA-C  benzonatate (TESSALON) 100 MG capsule Take 1 capsule (100 mg total) by mouth every 8 (eight) hours. 01/01/23  Yes Argusta Mcgann, Florene Route, PA-C  nirmatrelvir & ritonavir (PAXLOVID, 300/100,) 20 x 150 MG & 10 x 100MG  TBPK Take 3 tablets by mouth 2 (two) times daily for 5 days. 01/01/23 01/06/23 Yes Lesleigh Hughson, Florene Route, PA-C  cyclobenzaprine (FLEXERIL) 10 MG tablet Take 1 tablet (10 mg total) by mouth 2 (two) times daily as needed for muscle spasms. 07/19/18   Malvin Johns, MD  dicyclomine (BENTYL) 20 MG tablet Take 1 tablet (20 mg total) by mouth 2 (two) times daily. 03/04/18   Volanda Napoleon, PA-C  naproxen (NAPROSYN) 500 MG tablet Take 1 tablet (500 mg total) by mouth 2 (two) times daily. 07/19/18   Malvin Johns, MD  ondansetron (ZOFRAN) 4 MG tablet Take 1 tablet (4 mg total) by mouth every 6 (six) hours. 03/04/18   Volanda Napoleon, PA-C      Allergies    Rocephin [ceftriaxone]    Review of Systems   Review of Systems   Respiratory:  Positive for cough.   See HPI  Physical Exam Updated Vital Signs BP (!) 146/93 (BP Location: Right Arm)   Pulse 83   Temp 98.6 F (37 C) (Oral)   Resp 18   Ht 5\' 10"  (1.778 m)   SpO2 95%   BMI 37.31 kg/m  Physical Exam Constitutional:      General: He is not in acute distress. HENT:     Right Ear: Tympanic membrane, ear canal and external ear normal.     Left Ear: Tympanic membrane, ear canal and external ear normal.     Nose: Congestion and rhinorrhea present.     Mouth/Throat:     Mouth: Mucous membranes are moist.     Pharynx: Posterior oropharyngeal erythema present.  Eyes:     Extraocular Movements: Extraocular movements intact.     Conjunctiva/sclera: Conjunctivae normal.     Pupils: Pupils are equal, round, and reactive to light.  Cardiovascular:     Rate and Rhythm: Normal rate and regular rhythm.     Pulses: Normal pulses.     Heart sounds: Normal heart sounds.  Pulmonary:     Effort: Pulmonary effort is normal. No respiratory distress.     Breath sounds: Normal breath sounds.  Abdominal:     General: Abdomen is flat.     Palpations: Abdomen is soft.  Tenderness: There is no abdominal tenderness. There is no guarding.  Musculoskeletal:        General: Normal range of motion.     Cervical back: Normal range of motion.  Skin:    General: Skin is warm.     Capillary Refill: Capillary refill takes less than 2 seconds.  Neurological:     General: No focal deficit present.     Mental Status: He is alert and oriented to person, place, and time.  Psychiatric:        Mood and Affect: Mood normal.     ED Results / Procedures / Treatments   Labs (all labs ordered are listed, but only abnormal results are displayed) Labs Reviewed  RESP PANEL BY RT-PCR (RSV, FLU A&B, COVID)  RVPGX2 - Abnormal; Notable for the following components:      Result Value   SARS Coronavirus 2 by RT PCR POSITIVE (*)    All other components within normal limits     EKG None  Radiology DG Chest 2 View  Result Date: 01/01/2023 CLINICAL DATA:  Cough and chest pain for 3 days EXAM: CHEST - 2 VIEW COMPARISON:  Chest x-ray December 21, 2021 FINDINGS: The cardiomediastinal silhouette is unchanged in contour. No focal pulmonary opacity. No pleural effusion or pneumothorax. The visualized upper abdomen is unremarkable. No acute osseous abnormality. IMPRESSION: No acute cardiopulmonary abnormality. Electronically Signed   By: Jacob Moores M.D.   On: 01/01/2023 10:35    Procedures Procedures    Medications Ordered in ED Medications - No data to display  ED Course/ Medical Decision Making/ A&P                             Medical Decision Making Amount and/or Complexity of Data Reviewed Radiology: ordered.  Risk OTC drugs. Prescription drug management.   Matthew Woods 32 y.o. presented today for cough, fever. Working DDx that I considered at this time includes, but not limited to, URI, COVID, pneumonia.  Review of prior external notes: 12/21/21 ED Visit  Unique Tests and My Interpretation:  Respiratory panel: Positive for COVID Chest x-ray: Negative for any acute cardiopulmonary changes  Discussion with Independent Historian: Wife  Discussion of Management of Tests: None  Risk:    Medium:  - prescription drug management  Risk Stratification Score: none  Staffed with Fayrene Helper, PA-C  R/o DDx: Pneumonia: Chest x-ray is negative and no crackles are heard on exam  Plan: Patient presented with URI-like symptoms.  His respiratory panel was positive for COVID.  After conducting a physical exam and speaking with the patient, patient seems relatively healthy minus his blood pressure of 151/97 which is consistent with previous readings.  Patient had unremarkable physical exam.  I suspect patient's symptoms are due to COVID and prescribed Tessalon, Tylenol, Paxlovid for him to pick up.  I spoke with the patient about risk and benefits of  Paxlovid and the cost and informed him that if he chooses not to fill the prescription that is all right and that is his choice.  I told him that it is viral illnesses take 7 to 10 days and and that I will give him a work note for him to return next week to work.  In the meantime I educated him on the importance of maintaining fluids and food and rest for speeding up the recovery process.  Patient Verbalized agreement to this plan.  Final Clinical Impression(s) / ED Diagnoses Final diagnoses:  COVID    Rx / DC Orders ED Discharge Orders          Ordered    benzonatate (TESSALON) 100 MG capsule  Every 8 hours        01/01/23 1221    acetaminophen (TYLENOL) 500 MG tablet  Every 6 hours PRN        01/01/23 1221    nirmatrelvir & ritonavir (PAXLOVID, 300/100,) 20 x 150 MG & 10 x 100MG  TBPK  2 times daily        01/01/23 1221              Elvina Sidle 01/01/23 1245    Fransico Meadow, MD 01/03/23 1415

## 2023-01-01 NOTE — ED Triage Notes (Signed)
C/o cough, shortness of breath, pain with coughing in chest, fever, headache x 2 days.

## 2023-01-19 IMAGING — DX DG CHEST 2V
2 series · 2 of 2 positions shown · non-contrast
Comparison: chest x-ray 05/21/2020, chest x-ray 05/17/2020

CLINICAL DATA: cough, short of breath, concern for pna

EXAM:
CHEST - 2 VIEW

[chest pa]
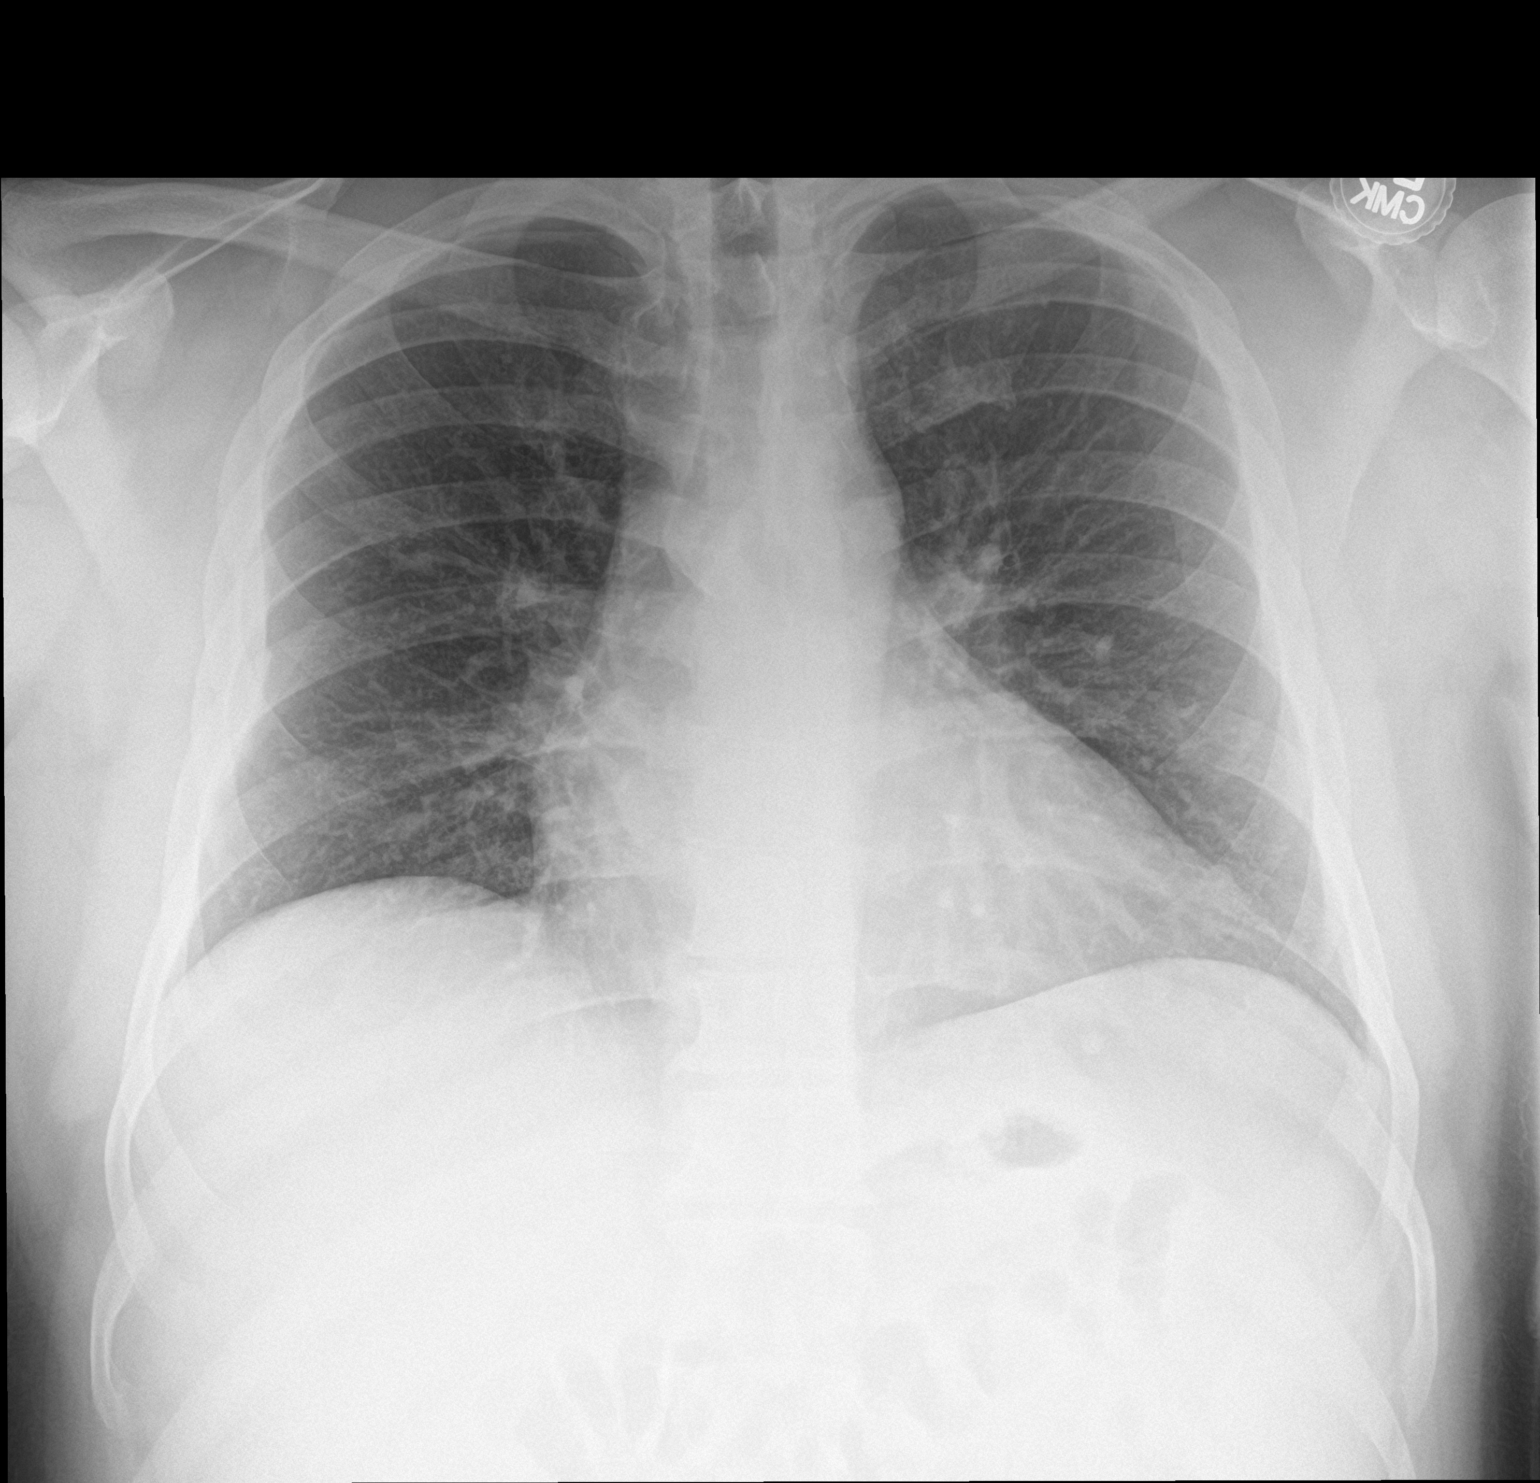

[chest lat]
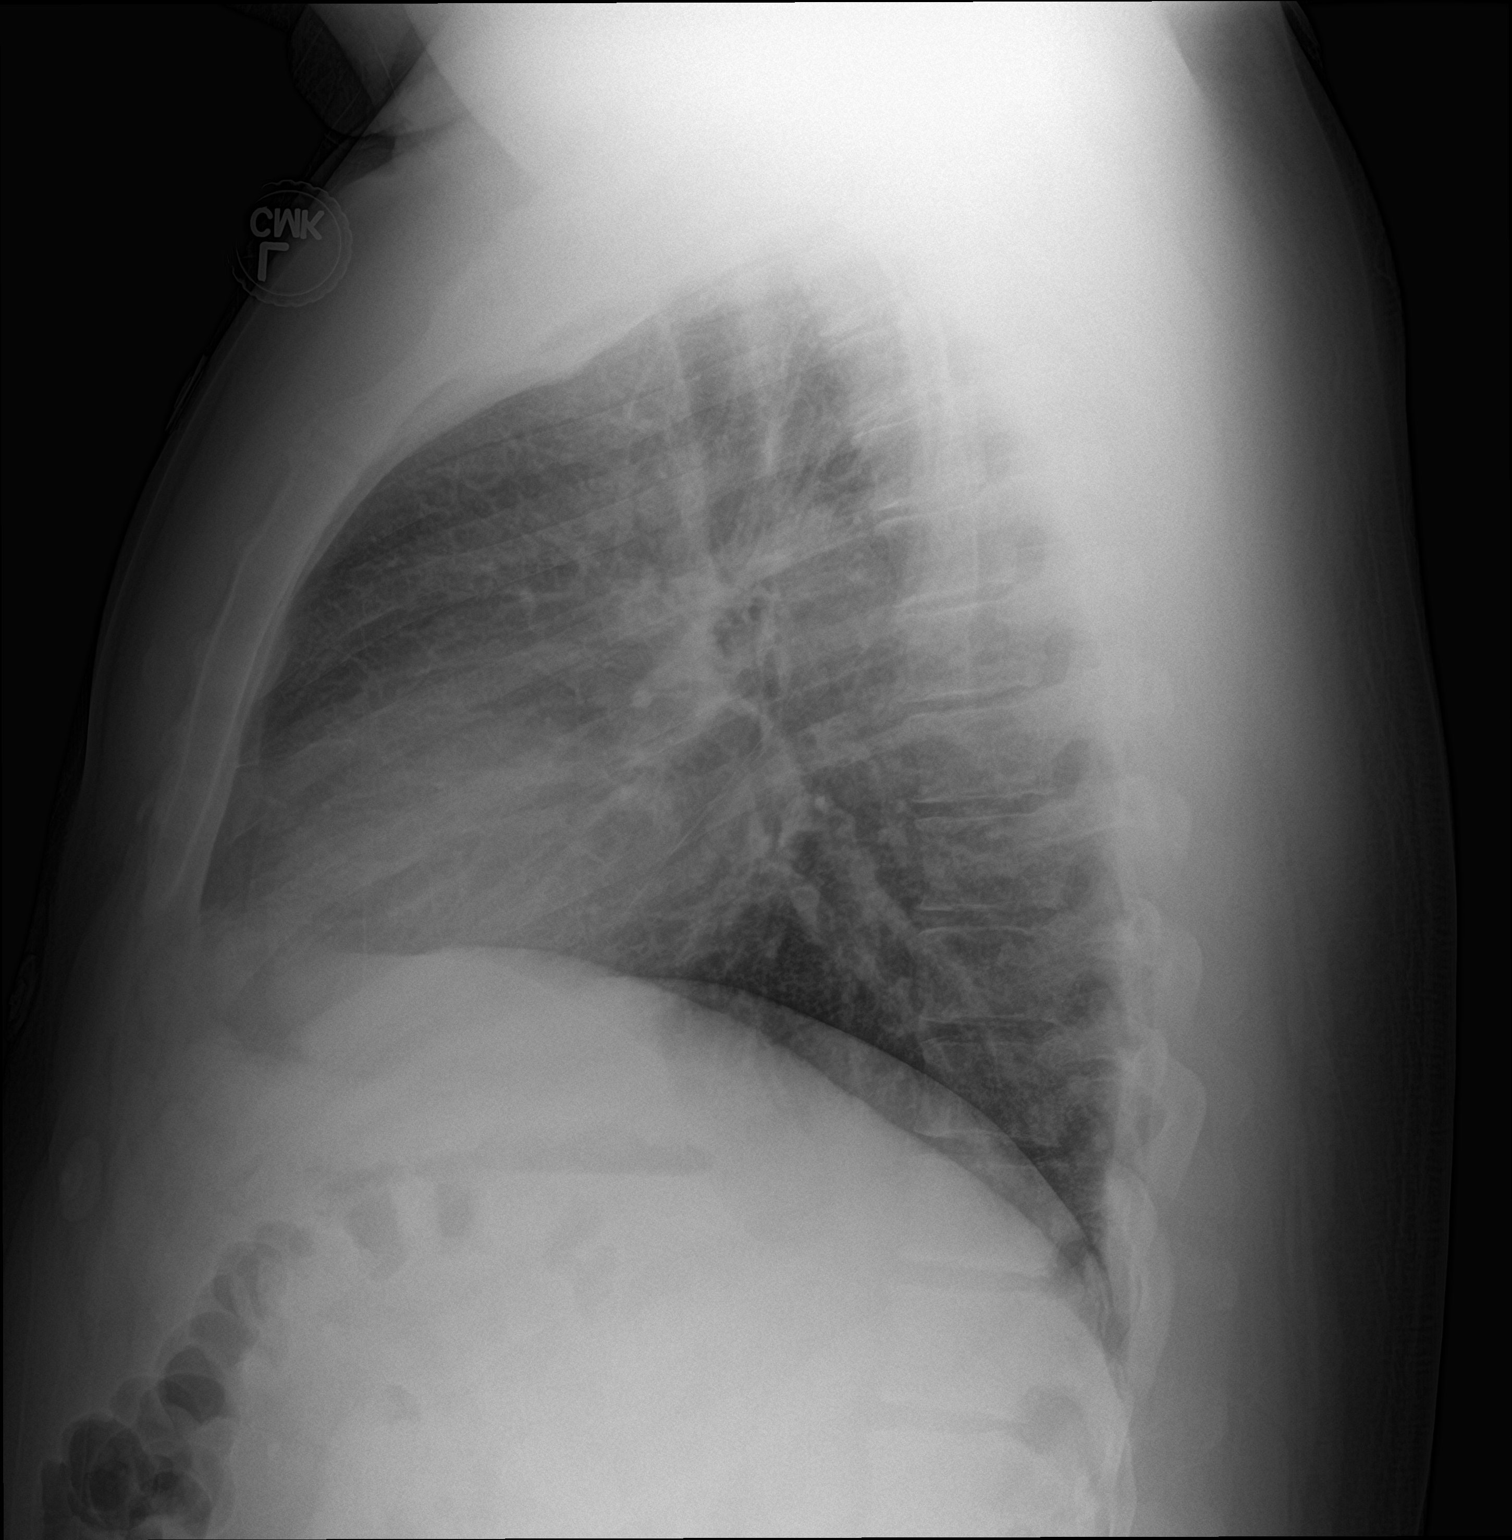

[2 of 2 positions shown; findings below may reference images not displayed]

FINDINGS: The heart and mediastinal contours are unchanged.

No focal consolidation. No pulmonary edema. No pleural effusion. No
pneumothorax.

No acute osseous abnormality.
IMPRESSION: No active cardiopulmonary disease.

## 2024-07-12 ENCOUNTER — Emergency Department (HOSPITAL_BASED_OUTPATIENT_CLINIC_OR_DEPARTMENT_OTHER)
Admission: EM | Admit: 2024-07-12 | Discharge: 2024-07-12 | Disposition: A | Discharge status: Home / Self Care | Attending: Emergency Medicine | Admitting: Emergency Medicine

## 2024-07-12 ENCOUNTER — Encounter (HOSPITAL_BASED_OUTPATIENT_CLINIC_OR_DEPARTMENT_OTHER): Payer: Self-pay | Admitting: Emergency Medicine

## 2024-07-12 ENCOUNTER — Other Ambulatory Visit: Payer: Self-pay

## 2024-07-12 DIAGNOSIS — R197 Diarrhea, unspecified: Secondary | ICD-10-CM | POA: Insufficient documentation

## 2024-07-12 DIAGNOSIS — R112 Nausea with vomiting, unspecified: Secondary | ICD-10-CM | POA: Diagnosis present

## 2024-07-12 LAB — COMPREHENSIVE METABOLIC PANEL WITH GFR
ALT: 35 U/L (ref 0–44)
AST: 27 U/L (ref 15–41)
Albumin: 4.3 g/dL (ref 3.5–5.0)
Alkaline Phosphatase: 115 U/L (ref 38–126)
Anion gap: 9 (ref 5–15)
BUN: 12 mg/dL (ref 6–20)
CO2: 27 mmol/L (ref 22–32)
Calcium: 9.5 mg/dL (ref 8.9–10.3)
Chloride: 104 mmol/L (ref 98–111)
Creatinine, Ser: 1.11 mg/dL (ref 0.61–1.24)
GFR, Estimated: >60 mL/min (ref >60)
Glucose, Bld: 96 mg/dL (ref 70–99)
Potassium: 3.7 mmol/L (ref 3.5–5.1)
Sodium: 140 mmol/L (ref 135–145)
Total Bilirubin: 0.4 mg/dL (ref 0.0–1.2)
Total Protein: 7.2 g/dL (ref 6.5–8.1)

## 2024-07-12 LAB — CBC
HCT: 41.8 % (ref 39.0–52.0)
Hemoglobin: 14.4 g/dL (ref 13.0–17.0)
MCH: 29.9 pg (ref 26.0–34.0)
MCHC: 34.4 g/dL (ref 30.0–36.0)
MCV: 86.9 fL (ref 80.0–100.0)
Platelets: 196 K/uL (ref 150–400)
RBC: 4.81 MIL/uL (ref 4.22–5.81)
RDW: 12 % (ref 11.5–15.5)
WBC: 10.4 K/uL (ref 4.0–10.5)
nRBC: 0 % (ref 0.0–0.2)

## 2024-07-12 LAB — URINALYSIS, ROUTINE W REFLEX MICROSCOPIC
Bilirubin Urine: NEGATIVE
Glucose, UA: NEGATIVE mg/dL
Ketones, ur: NEGATIVE mg/dL
Leukocytes,Ua: NEGATIVE
Nitrite: NEGATIVE
Protein, ur: NEGATIVE mg/dL
Specific Gravity, Urine: 1.025 (ref 1.005–1.030)
pH: 6 (ref 5.0–8.0)

## 2024-07-12 LAB — URINALYSIS, MICROSCOPIC (REFLEX)

## 2024-07-12 LAB — LIPASE, BLOOD: Lipase: 23 U/L (ref 11–51)

## 2024-07-12 MED ORDER — ONDANSETRON 4 MG PO TBDP: 4.0000 mg | ORAL_TABLET | Freq: Three times a day (TID) | ORAL | 0 refills | Status: AC | PRN

## 2024-07-12 MED ORDER — ONDANSETRON 4 MG PO TBDP
4.0000 mg | ORAL_TABLET | Freq: Once | ORAL | Status: AC
  Administered 2024-07-12: 4 mg via ORAL
  Filled 2024-07-12: qty 1

## 2024-07-12 NOTE — ED Triage Notes (Signed)
 Pt reports n/v/d, emesis x 4 since this morning, denies recent exposure to anyone sick, denies CP, hoB, cough or other s/s

## 2024-07-12 NOTE — Discharge Instructions (Addendum)
 Today you were seen for nausea, vomiting, and diarrhea.  I suspect you likely have a viral GI illness.  Please pick up your Zofran  and take as needed for nausea and vomiting.  You may also take Imodium over-the-counter sparingly for diarrhea.  Please return to the ED if you have uncontrollable vomiting, severe abdominal pain, or blood in your stool.  You were also found to be hypertensive while in the ED, please follow-up with your primary care for further evaluation and possible treatment for hypertension.  Thank you for letting us  treat you today. After reviewing your labs and imaging, I feel you are safe to go home. Please follow up with your PCP in the next several days and provide them with your records from this visit. Return to the Emergency Room if pain becomes severe or symptoms worsen.

## 2024-07-12 NOTE — ED Provider Notes (Signed)
 [facility] Provider Note   CSN: [id number] Arrival date & time: 07/12/24  1314     Patient presents with: Emesis   Matthew Woods is a 33 y.o. male presents today for nausea, vomiting, and diarrhea since this morning.  Patient denies chest pain, shortness of breath, abdominal pain, cough, congestion, fever, chills, hematuria, dysuria, hematemesis, or blood in stool.    Emesis Associated symptoms: diarrhea        Prior to Admission medications   Medication Sig Start Date End Date Taking? Authorizing Provider  ondansetron  (ZOFRAN -ODT) 4 MG disintegrating tablet Take 1 tablet (4 mg total) by mouth every 8 (eight) hours as needed. 07/12/24  Yes Minka Knight N, PA-C  acetaminophen  (TYLENOL ) 500 MG tablet Take 1 tablet (500 mg total) by mouth every 6 (six) hours as needed. 01/01/23   Victor Lynwood DASEN, PA-C  benzonatate  (TESSALON ) 100 MG capsule Take 1 capsule (100 mg total) by mouth every 8 (eight) hours. 01/01/23   Victor Lynwood DASEN, PA-C  cyclobenzaprine  (FLEXERIL ) 10 MG tablet Take 1 tablet (10 mg total) by mouth 2 (two) times daily as needed for muscle spasms. 07/19/18   Lenor Hollering, MD  dicyclomine  (BENTYL ) 20 MG tablet Take 1 tablet (20 mg total) by mouth 2 (two) times daily. 03/04/18   Layden, Lindsey A, PA-C  naproxen  (NAPROSYN ) 500 MG tablet Take 1 tablet (500 mg total) by mouth 2 (two) times daily. 07/19/18   Lenor Hollering, MD  ondansetron  (ZOFRAN ) 4 MG tablet Take 1 tablet (4 mg total) by mouth every 6 (six) hours. 03/04/18   Layden, Lindsey A, PA-C    Allergies: Rocephin [ceftriaxone]    Review of Systems  Gastrointestinal:  Positive for diarrhea, nausea and vomiting.    Updated Vital Signs BP (!) 169/106   Pulse 80   Temp 98.5 F (36.9 C) (Oral)   Resp 17   Ht 5' 11 (1.803 m)   Wt 124.9 kg   SpO2 98%   BMI 38.41 kg/m   Physical Exam Vitals and nursing note reviewed.  Constitutional:      General: He is not in acute  distress.    Appearance: Normal appearance. He is well-developed. He is not ill-appearing or diaphoretic.  HENT:     Head: Normocephalic and atraumatic.     Right Ear: External ear normal.     Left Ear: External ear normal.     Nose: Nose normal.     Mouth/Throat:     Mouth: Mucous membranes are moist.     Pharynx: Oropharynx is clear.  Eyes:     Conjunctiva/sclera: Conjunctivae normal.  Cardiovascular:     Rate and Rhythm: Normal rate and regular rhythm.     Pulses: Normal pulses.     Heart sounds: Normal heart sounds. No murmur heard. Pulmonary:     Effort: Pulmonary effort is normal. No respiratory distress.     Breath sounds: Normal breath sounds.  Abdominal:     General: There is no distension.     Palpations: Abdomen is soft.     Tenderness: There is no abdominal tenderness.  Musculoskeletal:        General: No swelling.     Cervical back: Neck supple.  Skin:    General: Skin is warm and dry.     Capillary Refill: Capillary refill takes less than 2 seconds.  Neurological:     General: No focal deficit present.     Mental Status: He is  alert and oriented to person, place, and time.  Psychiatric:        Mood and Affect: Mood normal.     (all labs ordered are listed, but only abnormal results are displayed) Labs Reviewed  URINALYSIS, ROUTINE W REFLEX MICROSCOPIC - Abnormal; Notable for the following components:      Result Value   Hgb urine dipstick SMALL (*)    All other components within normal limits  URINALYSIS, MICROSCOPIC (REFLEX) - Abnormal; Notable for the following components:   Bacteria, UA RARE (*)    All other components within normal limits  LIPASE, BLOOD  COMPREHENSIVE METABOLIC PANEL WITH GFR  CBC    EKG: None  Radiology: No results found.   Procedures   Medications Ordered in the ED  ondansetron  (ZOFRAN -ODT) disintegrating tablet 4 mg (4 mg Oral Given 07/12/24 1545)                                    Medical Decision Making Amount  and/or Complexity of Data Reviewed Labs: ordered.  Risk Prescription drug management.   This patient presents to the ED for concern of nausea, vomiting, diarrhea differential diagnosis includes viral GI illness, pancreatitis, appendicitis, diverticulitis, SBO    Additional history obtained   Additional history obtained from Electronic Medical Record External records from outside source obtained and reviewed including Care Everywhere   Lab Tests:  I Ordered, and personally interpreted labs.  The pertinent results include: CBC, CMP, and lipase WNL, UA with small hemoglobin and rare bacteria   Medicines ordered and prescription drug management:  I ordered medication including Zofran  ODT    I have reviewed the patients home medicines and have made adjustments as needed   Problem List / ED Course:  Consider for admission or further workup however patient's vital signs, physical exam, and labs are reassuring.  Patient symptoms likely due to viral GI illness.  Patient tolerating p.o. intake prior to discharge.  Patient given course of Zofran  outpatient and advised to take Imodium sparingly over-the-counter as needed for diarrhea.  Patient also advised to follow-up with his PCP for further evaluation of hypertension as he was found to be hypertensive while in the emergency department.  Patient given return precautions.  I feel patient is safe for discharge at this time.       Final diagnoses:  Nausea vomiting and diarrhea    ED Discharge Orders          Ordered    ondansetron  (ZOFRAN -ODT) 4 MG disintegrating tablet  Every 8 hours PRN        07/12/24 1523               Francis Ileana SAILOR, PA-C 07/12/24 1612    Kammerer, Megan L, DO 07/13/24 [phone]
